# Patient Record
Sex: Female | Born: 1935 | Race: White | Hispanic: No | Smoking: Never smoker
Health system: Southern US, Community
[De-identification: ages and names within clinical notes are randomized; demographics above are authoritative.]

## PROBLEM LIST (undated history)

## (undated) DIAGNOSIS — I219 Acute myocardial infarction, unspecified: Secondary | ICD-10-CM

## (undated) DIAGNOSIS — I714 Abdominal aortic aneurysm, without rupture, unspecified: Secondary | ICD-10-CM

## (undated) DIAGNOSIS — Z5189 Encounter for other specified aftercare: Secondary | ICD-10-CM

## (undated) DIAGNOSIS — I509 Heart failure, unspecified: Secondary | ICD-10-CM

## (undated) DIAGNOSIS — Z9189 Other specified personal risk factors, not elsewhere classified: Secondary | ICD-10-CM

## (undated) DIAGNOSIS — Z9981 Dependence on supplemental oxygen: Secondary | ICD-10-CM

## (undated) DIAGNOSIS — F329 Major depressive disorder, single episode, unspecified: Secondary | ICD-10-CM

## (undated) DIAGNOSIS — G43909 Migraine, unspecified, not intractable, without status migrainosus: Secondary | ICD-10-CM

## (undated) DIAGNOSIS — R32 Unspecified urinary incontinence: Secondary | ICD-10-CM

## (undated) DIAGNOSIS — G20A1 Parkinson's disease without dyskinesia, without mention of fluctuations: Secondary | ICD-10-CM

## (undated) DIAGNOSIS — F32A Depression, unspecified: Secondary | ICD-10-CM

## (undated) DIAGNOSIS — G20 Parkinson's disease: Secondary | ICD-10-CM

## (undated) DIAGNOSIS — M1990 Unspecified osteoarthritis, unspecified site: Secondary | ICD-10-CM

## (undated) DIAGNOSIS — N390 Urinary tract infection, site not specified: Secondary | ICD-10-CM

## (undated) DIAGNOSIS — I723 Aneurysm of iliac artery: Secondary | ICD-10-CM

## (undated) HISTORY — PX: BREAST SURGERY: SHX581

## (undated) HISTORY — DX: Unspecified urinary incontinence: R32

## (undated) HISTORY — DX: Depression, unspecified: F32.A

## (undated) HISTORY — PX: KNEE SURGERY: SHX244

## (undated) HISTORY — DX: Urinary tract infection, site not specified: N39.0

## (undated) HISTORY — DX: Other specified personal risk factors, not elsewhere classified: Z91.89

## (undated) HISTORY — DX: Unspecified osteoarthritis, unspecified site: M19.90

## (undated) HISTORY — DX: Major depressive disorder, single episode, unspecified: F32.9

## (undated) HISTORY — DX: Migraine, unspecified, not intractable, without status migrainosus: G43.909

## (undated) HISTORY — DX: Encounter for other specified aftercare: Z51.89

---
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
## 1974-04-26 HISTORY — PX: FOOT SURGERY: SHX648

## 1978-04-26 HISTORY — PX: APPENDECTOMY: SHX54

## 2004-06-08 LAB — HM MAMMOGRAPHY: HM MAMMO: NORMAL

## 2013-04-26 HISTORY — PX: OTHER SURGICAL HISTORY: SHX169

## 2014-12-24 ENCOUNTER — Telehealth: Payer: Self-pay | Admitting: Nurse Practitioner

## 2014-12-24 ENCOUNTER — Ambulatory Visit: Payer: Self-pay | Admitting: Nurse Practitioner

## 2014-12-24 DIAGNOSIS — Z0289 Encounter for other administrative examinations: Secondary | ICD-10-CM

## 2014-12-24 NOTE — Telephone Encounter (Signed)
Your NP for today Jennifer Olson, Jennifer Olson is not able to make her appt. Her daughter went to get her up and her [name] is acting up she cannot stand up she's dizzy. Thank You!

## 2014-12-25 NOTE — Telephone Encounter (Signed)
Okay, thanks

## 2015-01-02 ENCOUNTER — Ambulatory Visit (INDEPENDENT_AMBULATORY_CARE_PROVIDER_SITE_OTHER): Payer: Medicare Other | Admitting: Nurse Practitioner

## 2015-01-02 ENCOUNTER — Encounter (INDEPENDENT_AMBULATORY_CARE_PROVIDER_SITE_OTHER): Payer: Self-pay

## 2015-01-02 ENCOUNTER — Encounter: Payer: Self-pay | Admitting: Nurse Practitioner

## 2015-01-02 VITALS — BP 102/64 | HR 79 | Temp 98.4°F | Resp 14 | Wt 205.4 lb

## 2015-01-02 DIAGNOSIS — J449 Chronic obstructive pulmonary disease, unspecified: Secondary | ICD-10-CM

## 2015-01-02 DIAGNOSIS — Z7689 Persons encountering health services in other specified circumstances: Secondary | ICD-10-CM | POA: Insufficient documentation

## 2015-01-02 DIAGNOSIS — F418 Other specified anxiety disorders: Secondary | ICD-10-CM | POA: Insufficient documentation

## 2015-01-02 DIAGNOSIS — E785 Hyperlipidemia, unspecified: Secondary | ICD-10-CM

## 2015-01-02 DIAGNOSIS — R32 Unspecified urinary incontinence: Secondary | ICD-10-CM

## 2015-01-02 DIAGNOSIS — I509 Heart failure, unspecified: Secondary | ICD-10-CM | POA: Insufficient documentation

## 2015-01-02 DIAGNOSIS — E559 Vitamin D deficiency, unspecified: Secondary | ICD-10-CM

## 2015-01-02 DIAGNOSIS — G479 Sleep disorder, unspecified: Secondary | ICD-10-CM

## 2015-01-02 DIAGNOSIS — Z7189 Other specified counseling: Secondary | ICD-10-CM

## 2015-01-02 DIAGNOSIS — K219 Gastro-esophageal reflux disease without esophagitis: Secondary | ICD-10-CM | POA: Insufficient documentation

## 2015-01-02 DIAGNOSIS — M1990 Unspecified osteoarthritis, unspecified site: Secondary | ICD-10-CM | POA: Insufficient documentation

## 2015-01-02 MED ORDER — ROPINIROLE HCL 2 MG PO TABS: 2.0000 mg | ORAL_TABLET | Freq: Three times a day (TID) | ORAL | Status: DC

## 2015-01-02 MED ORDER — TRAZODONE HCL 50 MG PO TABS: 50.0000 mg | ORAL_TABLET | Freq: Every day | ORAL | Status: AC

## 2015-01-02 MED ORDER — POTASSIUM CHLORIDE ER 20 MEQ PO TBCR
40.0000 meq | EXTENDED_RELEASE_TABLET | Freq: Every day | ORAL | Status: AC
Start: 2015-01-02 — End: ?

## 2015-01-02 MED ORDER — TAMSULOSIN HCL 0.4 MG PO CAPS: 0.4000 mg | ORAL_CAPSULE | Freq: Every day | ORAL | Status: DC

## 2015-01-02 MED ORDER — SOLIFENACIN SUCCINATE 10 MG PO TABS: 10.0000 mg | ORAL_TABLET | Freq: Every day | ORAL | Status: DC

## 2015-01-02 MED ORDER — ERGOCALCIFEROL 1.25 MG (50000 UT) PO CAPS: 50000.0000 [IU] | ORAL_CAPSULE | ORAL | Status: DC

## 2015-01-02 MED ORDER — BUSPIRONE HCL 5 MG PO TABS: 5.0000 mg | ORAL_TABLET | Freq: Two times a day (BID) | ORAL | Status: AC

## 2015-01-02 MED ORDER — ESCITALOPRAM OXALATE 10 MG PO TABS: 10.0000 mg | ORAL_TABLET | Freq: Every day | ORAL | Status: AC

## 2015-01-02 MED ORDER — SIMVASTATIN 20 MG PO TABS: 20.0000 mg | ORAL_TABLET | Freq: Every day | ORAL | Status: AC

## 2015-01-02 MED ORDER — PREDNISONE 5 MG PO TABS: 5.0000 mg | ORAL_TABLET | Freq: Every day | ORAL | Status: DC

## 2015-01-02 MED ORDER — PANTOPRAZOLE SODIUM 20 MG PO TBEC: 20.0000 mg | DELAYED_RELEASE_TABLET | Freq: Every day | ORAL | Status: AC

## 2015-01-02 MED ORDER — MELOXICAM 15 MG PO TABS: 15.0000 mg | ORAL_TABLET | Freq: Every day | ORAL | Status: AC

## 2015-01-02 MED ORDER — FUROSEMIDE 40 MG PO TABS: 40.0000 mg | ORAL_TABLET | Freq: Every day | ORAL | Status: AC

## 2015-01-02 NOTE — Assessment & Plan Note (Addendum)
Discussed acute and chronic issues. Reviewed health maintenance measures, PFSHx, and immunizations. Obtain records from previous facility.   Time spent face to face was 45 minutes with greater than 50% of time spent on discussing health history, reviewing records, medication reconcilliation, and discussing multiple complaints.

## 2015-01-02 NOTE — Assessment & Plan Note (Signed)
Patient is on nighttime oxygen. Only information that was given in report from past assisted living facility was that she is on oxygen and that they're using [employer] for supplies. Patient nor daughter have further information on this today.

## 2015-01-02 NOTE — Assessment & Plan Note (Addendum)
Unsure if patient is using Requip for restless leg syndrome, patient reports "tremors". Will refill today.  Patient stable on trazodone 50 mg at nighttime will refill this as well.

## 2015-01-02 NOTE — Progress Notes (Signed)
Pre visit review using our clinic review tool, if applicable. No additional management support is needed unless otherwise documented below in the visit note. 

## 2015-01-02 NOTE — Progress Notes (Signed)
Patient ID: Jennifer Olson, female    DOB: 1926/01/11  Age: 81 y.o. MRN: [id number]  CC: Establish Care   HPI Jennifer Olson presents for establishing care and CC of medication refills, hoarseness, pruritic ears, and getting set up with referrals for care. Patient is accompanied by daughter today.   1) New pt info:   Immunizations- pna 2015, tdap 2015   Mammogram- 2006  Bone Density- Unknown   Colonoscopy- Last one 1974  Eye Exam- 11/2014, normal per pt  Dental Exam- Dentures, does not wear due to poor fit  2) Chronic Problems-  COPD- Unknown info- need records   Patient using oxygen at night, unknown supplies or settings. Patient is using [employer] for supplies  CHF- Lasix 40 mg daily, unknown last cardiology visit   Arthritis- Was taking Oxycodone-APAP unknown dosage every 12 hours.  3) Acute Problems-  Medication refills: Pt daughter reports pain medications and Xanax did not come with her to [place].  Recent move from a nursing home in [place]. Patient's daughter has minimal records from nursing home.   Patient lives with daughter, son-in-law, granddaughter and no home health is requested at this time.  Patient's daughter will be transporting her to and from her appointments. They will be maintaining her diet at home.  Patient reports she would not like to pursue physical therapy at this time due to doing own exercises at home.   Pt reports hoarseness today and itching ears.   History Jennifer Olson has a past medical history of Arthritis; Depression; History of fainting spells of unknown cause; Migraines; Blood transfusion without reported diagnosis; UTI (urinary tract infection); and Urinary incontinence.   She has past surgical history that includes Breast surgery; Appendectomy (1980); Tonsillectomy and adenoidectomy (1934); Cesarean section; Foot surgery (1976); Knee surgery; and Broken Hip (2015).   Her family history includes Sudden death in her brother.She reports that she has  never smoked. She does not have any smokeless tobacco history on file. She reports that she does not drink alcohol or use illicit drugs.  No outpatient prescriptions prior to visit.   No facility-administered medications prior to visit.    ROS Review of Systems  Constitutional: Negative for fever, chills, diaphoresis and fatigue.  HENT: Positive for tinnitus. Negative for trouble swallowing.   Eyes: Negative for visual disturbance.  Respiratory: Negative for chest tightness, shortness of breath and wheezing.   Cardiovascular: Positive for leg swelling. Negative for chest pain and palpitations.  Gastrointestinal: Negative for nausea, vomiting, diarrhea and constipation.  Genitourinary: Positive for urgency and frequency.  Musculoskeletal: Positive for back pain, arthralgias and gait problem.       Patient is in wheelchair today  Skin: Negative for rash.  Neurological: Positive for dizziness and tremors. Negative for weakness, numbness and headaches.       Intermittent dizziness  Psychiatric/Behavioral: Positive for sleep disturbance. Negative for suicidal ideas. The patient is nervous/anxious.     Objective:  BP 102/64 mmHg  Pulse 79  Temp(Src) 98.4 F (36.9 C)  Resp 14  Wt 205 lb 6.4 oz (93.169 kg)  SpO2 94%  Physical Exam  Constitutional: She is oriented to person, place, and time. She appears well-developed and well-nourished. No distress.  HENT:  Head: Normocephalic and atraumatic.  Right Ear: External ear normal.  Left Ear: External ear normal.  Mouth/Throat: No oropharyngeal exudate.  No teeth  Eyes: Right eye exhibits no discharge. Left eye exhibits no discharge. No scleral icterus.  Glasses  Pulmonary/Chest: Effort normal.  Musculoskeletal:  Toes are arthritic and on right foot second toe is crossed over great toe  Neurological: She is alert and oriented to person, place, and time.  Skin: Skin is warm and dry. No rash noted. She is not diaphoretic.  Psychiatric:  She has a normal mood and affect. Her behavior is normal. Judgment and thought content normal.   Assessment & Plan:   Jennifer Olson was seen today for establish care.  Diagnoses and all orders for this visit:  Depression with anxiety  Encounter to establish care  Sleep disturbance  Urinary incontinence, unspecified incontinence type  Hyperlipidemia  Gastroesophageal reflux disease without esophagitis  Arthritis  Congestive heart failure, unspecified congestive heart failure chronicity, unspecified congestive heart failure type  Vitamin D deficiency  Chronic obstructive pulmonary disease, unspecified COPD, unspecified chronic bronchitis type  Other orders -     traZODone (DESYREL) 50 MG tablet; Take 1 tablet (50 mg total) by mouth at bedtime. -     tamsulosin (FLOMAX) 0.4 MG CAPS capsule; Take 1 capsule (0.4 mg total) by mouth at bedtime. -     solifenacin (VESICARE) 10 MG tablet; Take 1 tablet (10 mg total) by mouth daily. -     Potassium Chloride ER 20 MEQ TBCR; Take 40 mEq by mouth daily with breakfast. -     pantoprazole (PROTONIX) 20 MG tablet; Take 1 tablet (20 mg total) by mouth daily. -     meloxicam (MOBIC) 15 MG tablet; Take 1 tablet (15 mg total) by mouth daily. -     furosemide (LASIX) 40 MG tablet; Take 1 tablet (40 mg total) by mouth daily. -     escitalopram (LEXAPRO) 10 MG tablet; Take 1 tablet (10 mg total) by mouth daily. -     ergocalciferol (VITAMIN D2) 50000 UNITS capsule; Take 1 capsule (50,000 Units total) by mouth once a week. -     predniSONE (DELTASONE) 5 MG tablet; Take 1 tablet (5 mg total) by mouth daily with breakfast. -     rOPINIRole (REQUIP) 2 MG tablet; Take 1 tablet (2 mg total) by mouth 3 (three) times daily. -     simvastatin (ZOCOR) 20 MG tablet; Take 1 tablet (20 mg total) by mouth at bedtime. -     busPIRone (BUSPAR) 5 MG tablet; Take 1 tablet (5 mg total) by mouth 2 (two) times daily.   I have discontinued Jennifer Olson's ALPRAZOLAM PO,  OXYCODONE ER PO, potassium chloride, furosemide, rOPINIRole, cholecalciferol, escitalopram, predniSONE, and simvastatin. I have also changed her traZODone, tamsulosin, solifenacin, pantoprazole, meloxicam, furosemide, escitalopram, and ergocalciferol. Additionally, I am having her start on predniSONE, rOPINIRole, simvastatin, and busPIRone. Lastly, I am having her maintain her fluticasone and Potassium Chloride ER.  Meds ordered this encounter  Medications  . DISCONTD: ALPRAZOLAM PO    Sig: Take by mouth as needed (1/2 tablet daily as needed).  . DISCONTD: OXYCODONE ER PO    Sig: Take by mouth every 12 (twelve) hours. UNKNOWN MG  . DISCONTD: tamsulosin (FLOMAX) 0.4 MG CAPS capsule    Sig: Take 0.4 mg by mouth at bedtime. UNKNOWN MG  . DISCONTD: pantoprazole (PROTONIX) 20 MG tablet    Sig: Take 20 mg by mouth daily. UNKNOWN MG  . DISCONTD: potassium chloride (K-DUR,KLOR-CON) 10 MEQ tablet    Sig: Take 10 mEq by mouth 2 (two) times daily. UNKNOWN MG  . DISCONTD: furosemide (LASIX) 10 MG/ML solution    Sig: Take by mouth daily. UNKNOWN MG  . DISCONTD: rOPINIRole (REQUIP) 1 MG tablet  Sig: Take 1 mg by mouth 3 (three) times daily. UNKNOWN MG  . DISCONTD: solifenacin (VESICARE) 10 MG tablet    Sig: Take 10 mg by mouth daily.   [name] DISCONTD: cholecalciferol (VITAMIN D) 1000 UNITS tablet    Sig: Take 1,000 Units by mouth daily. UNKNOWN UNITS  . DISCONTD: escitalopram (LEXAPRO) 5 MG tablet    Sig: Take 5 mg by mouth daily. UNKNOWN MG  . DISCONTD: predniSONE (DELTASONE) 1 MG tablet    Sig: Take 1 mg by mouth daily with breakfast. UNKNOWN MG  . DISCONTD: simvastatin (ZOCOR) 10 MG tablet    Sig: Take 10 mg by mouth daily. UNKNOWN MG  . DISCONTD: traZODone (DESYREL) 50 MG tablet    Sig: Take 50 mg by mouth at bedtime. UNKNOWN DOSE  . DISCONTD: meloxicam (MOBIC) 15 MG tablet    Sig: Take 15 mg by mouth daily. UNKNOWN MG  . DISCONTD: fluticasone (VERAMYST) 27.5 MCG/SPRAY nasal spray    Sig: Place  2 sprays into the nose daily. UNKNOWN DOSE  . DISCONTD: ergocalciferol (VITAMIN D2) 50000 UNITS capsule    Sig: Take 50,000 Units by mouth once a week.  [name] DISCONTD: escitalopram (LEXAPRO) 10 MG tablet    Sig: Take 10 mg by mouth daily.  . fluticasone (FLONASE) 50 MCG/ACT nasal spray    Sig: Place 2 sprays into both nostrils daily.  [name] DISCONTD: furosemide (LASIX) 40 MG tablet    Sig: Take 40 mg by mouth daily.  [name] DISCONTD: Potassium Chloride ER 20 MEQ TBCR    Sig: Take 40 mEq by mouth daily with breakfast.  . traZODone (DESYREL) 50 MG tablet    Sig: Take 1 tablet (50 mg total) by mouth at bedtime.    Dispense:  30 tablet    Refill:  2    Order Specific Question:  Supervising Provider    Answer:  Duncan Dull L [[id number]]  . tamsulosin (FLOMAX) 0.4 MG CAPS capsule    Sig: Take 1 capsule (0.4 mg total) by mouth at bedtime.    Dispense:  30 capsule    Refill:  2    Order Specific Question:  Supervising Provider    Answer:  Duncan Dull L [[id number]]  . solifenacin (VESICARE) 10 MG tablet    Sig: Take 1 tablet (10 mg total) by mouth daily.    Dispense:  30 tablet    Refill:  2    Order Specific Question:  Supervising Provider    Answer:  Duncan Dull L [[id number]]  . Potassium Chloride ER 20 MEQ TBCR    Sig: Take 40 mEq by mouth daily with breakfast.    Dispense:  60 tablet    Refill:  2    Order Specific Question:  Supervising Provider    Answer:  Duncan Dull L [[id number]]  . pantoprazole (PROTONIX) 20 MG tablet    Sig: Take 1 tablet (20 mg total) by mouth daily.    Dispense:  30 tablet    Refill:  2    Order Specific Question:  Supervising Provider    Answer:  Duncan Dull L [[id number]]  . meloxicam (MOBIC) 15 MG tablet    Sig: Take 1 tablet (15 mg total) by mouth daily.    Dispense:  30 tablet    Refill:  2    Order Specific Question:  Supervising Provider    Answer:  Duncan Dull L [[id number]]  . furosemide (LASIX) 40 MG tablet    Sig: Take 1 tablet (40 mg  total) by mouth daily.     Dispense:  30 tablet    Refill:  2    Order Specific Question:  Supervising Provider    Answer:  Duncan Dull L [[id number]]  . escitalopram (LEXAPRO) 10 MG tablet    Sig: Take 1 tablet (10 mg total) by mouth daily.    Dispense:  30 tablet    Refill:  2    Order Specific Question:  Supervising Provider    Answer:  Duncan Dull L [[id number]]  . ergocalciferol (VITAMIN D2) 50000 UNITS capsule    Sig: Take 1 capsule (50,000 Units total) by mouth once a week.    Dispense:  12 capsule    Refill:  0    Order Specific Question:  Supervising Provider    Answer:  Duncan Dull L [[id number]]  . predniSONE (DELTASONE) 5 MG tablet    Sig: Take 1 tablet (5 mg total) by mouth daily with breakfast.    Dispense:  30 tablet    Refill:  2    Order Specific Question:  Supervising Provider    Answer:  Duncan Dull L [[id number]]  . rOPINIRole (REQUIP) 2 MG tablet    Sig: Take 1 tablet (2 mg total) by mouth 3 (three) times daily.    Dispense:  90 tablet    Refill:  2    Order Specific Question:  Supervising Provider    Answer:  Duncan Dull L [[id number]]  . simvastatin (ZOCOR) 20 MG tablet    Sig: Take 1 tablet (20 mg total) by mouth at bedtime.    Dispense:  30 tablet    Refill:  2    Order Specific Question:  Supervising Provider    Answer:  Duncan Dull L [[id number]]  . busPIRone (BUSPAR) 5 MG tablet    Sig: Take 1 tablet (5 mg total) by mouth 2 (two) times daily.    Dispense:  60 tablet    Refill:  2    Order Specific Question:  Supervising Provider    Answer:  Sherlene Shams [[id number]]     Follow-up: Return in about 4 weeks (around 01/30/2015) for Multiple concerns .

## 2015-01-02 NOTE — Patient Instructions (Signed)
Your medications will be sent to the pharmacy tonight.   Drink water! 3-4 cups daily   Ears- use Vaseline on your pinky finger and wipe it around the outside of your ear   No Q-tips   We will up your vesicare (for over active bladder to 10 mg daily instead of 5 mg)   Busprione will be sent in for your nerves instead of xanax.   We will get you set up with a Cardiologist- heart failure      Podiatrist- feet concerns     Orthopedist- arthritis  Follow up with me in 1 month.

## 2015-01-02 NOTE — Assessment & Plan Note (Signed)
Patient reports extensive arthritis. Patient reports it is worse in shoulders and back. Patient also reports hands and knees. Patient was on pain medication and [place]. Patient will be set up with orthopedics for evaluation and pain management.

## 2015-01-02 NOTE — Assessment & Plan Note (Addendum)
Patient reports she is stable on current Lexapro. I discussed not using Xanax. Patient has been off for some time now. Patient is willing to try BuSpar. Patient had a counselor come to her assisted living facility once a month. We'll follow-up in one month

## 2015-01-02 NOTE — Assessment & Plan Note (Signed)
Patient reports taking vitamin D2 50,000 units once weekly on Monday nights. Will obtain last labs.

## 2015-01-02 NOTE — Assessment & Plan Note (Signed)
Diagnosis was written on sheet from assisted living facility. Will obtain more records regarding this. We'll set up with cardiology. Will refill Lasix 40 mg daily. Patient is also on potassium 20 mEq twice daily.

## 2015-01-02 NOTE — Assessment & Plan Note (Signed)
Patient is stable on pantoprazole. Will refill this today patient reports some hoarseness possibly due to GERD.

## 2015-01-02 NOTE — Assessment & Plan Note (Signed)
Patient is using adult pull-up in combination with an extra pad. Patient reports she is unsure about seeing a urologist in the past. Patient is currently on Flomax and Vesicare. Patient is also on Lasix which exacerbates issue. We'll refill the Vesicare and Flomax today. We'll follow-up in one month.

## 2015-01-02 NOTE — Assessment & Plan Note (Signed)
Patient is currently stable on simvastatin. Will refill and obtain last labs.

## 2015-01-06 ENCOUNTER — Other Ambulatory Visit: Payer: Self-pay | Admitting: Nurse Practitioner

## 2015-01-07 ENCOUNTER — Other Ambulatory Visit: Payer: Self-pay | Admitting: Nurse Practitioner

## 2015-01-07 DIAGNOSIS — I509 Heart failure, unspecified: Secondary | ICD-10-CM

## 2015-01-07 DIAGNOSIS — M2550 Pain in unspecified joint: Secondary | ICD-10-CM

## 2015-01-07 DIAGNOSIS — M2060 Acquired deformities of toe(s), unspecified, unspecified foot: Secondary | ICD-10-CM

## 2015-01-08 ENCOUNTER — Encounter: Payer: Self-pay | Admitting: Nurse Practitioner

## 2015-01-10 ENCOUNTER — Encounter: Payer: Self-pay | Admitting: Nurse Practitioner

## 2015-01-16 ENCOUNTER — Encounter: Payer: Self-pay | Admitting: *Deleted

## 2015-01-16 ENCOUNTER — Emergency Department
Admission: EM | Admit: 2015-01-16 | Discharge: 2015-01-16 | Disposition: A | Discharge status: Home / Self Care | Payer: Medicare Other | Attending: Emergency Medicine | Admitting: Emergency Medicine

## 2015-01-16 ENCOUNTER — Emergency Department: Payer: Medicare Other

## 2015-01-16 DIAGNOSIS — Z79899 Other long term (current) drug therapy: Secondary | ICD-10-CM | POA: Insufficient documentation

## 2015-01-16 DIAGNOSIS — G8929 Other chronic pain: Secondary | ICD-10-CM | POA: Insufficient documentation

## 2015-01-16 DIAGNOSIS — M545 Low back pain, unspecified: Secondary | ICD-10-CM

## 2015-01-16 DIAGNOSIS — S59912A Unspecified injury of left forearm, initial encounter: Secondary | ICD-10-CM | POA: Diagnosis present

## 2015-01-16 DIAGNOSIS — Z880 Allergy status to penicillin: Secondary | ICD-10-CM | POA: Diagnosis not present

## 2015-01-16 DIAGNOSIS — Y9289 Other specified places as the place of occurrence of the external cause: Secondary | ICD-10-CM | POA: Insufficient documentation

## 2015-01-16 DIAGNOSIS — Y9389 Activity, other specified: Secondary | ICD-10-CM | POA: Insufficient documentation

## 2015-01-16 DIAGNOSIS — S3992XA Unspecified injury of lower back, initial encounter: Secondary | ICD-10-CM | POA: Diagnosis not present

## 2015-01-16 DIAGNOSIS — Y998 Other external cause status: Secondary | ICD-10-CM | POA: Diagnosis not present

## 2015-01-16 DIAGNOSIS — S51812A Laceration without foreign body of left forearm, initial encounter: Secondary | ICD-10-CM | POA: Insufficient documentation

## 2015-01-16 DIAGNOSIS — Z791 Long term (current) use of non-steroidal anti-inflammatories (NSAID): Secondary | ICD-10-CM | POA: Diagnosis not present

## 2015-01-16 DIAGNOSIS — W1839XA Other fall on same level, initial encounter: Secondary | ICD-10-CM | POA: Diagnosis not present

## 2015-01-16 HISTORY — DX: Abdominal aortic aneurysm, without rupture, unspecified: I71.40

## 2015-01-16 HISTORY — DX: Abdominal aortic aneurysm, without rupture: I71.4

## 2015-01-16 HISTORY — DX: Aneurysm of iliac artery: I72.3

## 2015-01-16 MED ORDER — CYCLOBENZAPRINE HCL 10 MG PO TABS
10.0000 mg | ORAL_TABLET | Freq: Once | ORAL | Status: AC
  Administered 2015-01-16: 10 mg via ORAL
  Filled 2015-01-16: qty 1

## 2015-01-16 MED ORDER — BACITRACIN ZINC 500 UNIT/GM EX OINT: TOPICAL_OINTMENT | CUTANEOUS | Status: DC

## 2015-01-16 NOTE — Discharge Instructions (Signed)
Chronic Back Pain  When back pain lasts longer than 3 months, it is called chronic back pain.People with chronic back pain often go through certain periods that are more intense (flare-ups).  CAUSES Chronic back pain can be caused by wear and tear (degeneration) on different structures in your back. These structures include:  The bones of your spine (vertebrae) and the joints surrounding your spinal cord and nerve roots (facets).  The strong, fibrous tissues that connect your vertebrae (ligaments). Degeneration of these structures may result in pressure on your nerves. This can lead to constant pain. HOME CARE INSTRUCTIONS  Avoid bending, heavy lifting, prolonged sitting, and activities which make the problem worse.  Take brief periods of rest throughout the day to reduce your pain. Lying down or standing usually is better than sitting while you are resting.  Take over-the-counter or prescription medicines only as directed by your caregiver. SEEK IMMEDIATE MEDICAL CARE IF:   You have weakness or numbness in one of your legs or feet.  You have trouble controlling your bladder or bowels.  You have nausea, vomiting, abdominal pain, shortness of breath, or fainting. Document Released: 05/20/2004 Document Revised: 07/05/2011 Document Reviewed: 03/27/2011 [facility] Patient Information 2015 [place], [place]. This information is not intended to replace advice given to you by your health care provider. Make sure you discuss any questions you have with your health care provider.  Skin Tear Care A skin tear is a wound in which the top layer of skin has peeled off. This is a common problem with aging because the skin becomes thinner and more fragile as a person gets older. In addition, some medicines, such as oral corticosteroids, can lead to skin thinning if taken for long periods of time.  A skin tear is often repaired with tape or skin adhesive strips. This keeps the skin that has been peeled off  in contact with the healthier skin beneath. Depending on the location of the wound, a bandage (dressing) may be applied over the tape or skin adhesive strips. Sometimes, during the healing process, the skin turns black and dies. Even when this happens, the torn skin acts as a good dressing until the skin underneath gets healthier and repairs itself. HOME CARE INSTRUCTIONS   Change dressings once per day or as directed by your caregiver.  Gently clean the skin tear and the area around the tear using saline solution or mild soap and water.  Do not rub the injured skin dry. Let the area air dry.  Apply petroleum jelly or an antibiotic cream or ointment to keep the tear moist. This will help the wound heal. Do not allow a scab to form.  If the dressing sticks before the next dressing change, moisten it with warm soapy water and gently remove it.  Protect the injured skin until it has healed.  Only take over-the-counter or prescription medicines as directed by your caregiver.  Take showers or baths using warm soapy water. Apply a new dressing after the shower or bath.  Keep all follow-up appointments as directed by your caregiver.  SEEK IMMEDIATE MEDICAL CARE IF:   You have redness, swelling, or increasing pain in the skin tear.  You havepus coming from the skin tear.  You have chills.  You have a red streak that goes away from the skin tear.  You have a bad smell coming from the tear or dressing.  You have a fever or persistent symptoms for more than 2-3 days.  You have a fever  and your symptoms suddenly get worse. MAKE SURE YOU:  Understand these instructions.  Will watch this condition.  Will get help right away if your child is not doing well or gets worse. Document Released: 01/05/2001 Document Revised: 01/05/2012 Document Reviewed: 10/25/2011 [facility] Patient Information 2015 [place], [place]. This information is not intended to replace advice given to you by your health  care provider. Make sure you discuss any questions you have with your health care provider.

## 2015-01-16 NOTE — ED Provider Notes (Signed)
[facility] Emergency Department Provider Note  ____________________________________________  Time seen: Approximately 8 PM  I have reviewed the triage vital signs and the nursing notes.   HISTORY  Chief Complaint Fall    HPI Jennifer Olson is a 81 y.o. female with a history of chronic back pain who is presenting with worsening lower back pain after fall 2 weeks ago. The patient normally takes 2x7.5 mg oxycodone for pain control as of 3 weeks ago butsince leaving her nursing home 2 weeks ago she has been trying to wean herself off, only taking one a time. The patient was walking without her walker when she fell backwards. She has balance issues which is why she uses the walker. She says that she lost balance when she wasn't using her walker and fell backwards onto her coccyx. She did not hit her head or lose consciousness. She does have baseline urinary incontinence but has no worsening of incontinence or loss of continence of her stool. She describes the pain is in her lower buttock as well as the left side of the pelvis. There is a small amount of radiation into the proximal thighs posteriorly. Denies any numbness or weakness. However, does have pain with range of motion of her legs bilaterally.   Past Medical History  Diagnosis Date  . Arthritis   . Depression   . History of fainting spells of unknown cause   . Migraines   . Blood transfusion without reported diagnosis   . UTI (urinary tract infection)   . Urinary incontinence   . Aneurysm of abdominal aorta   . Aneurysm artery, iliac     Patient Active Problem List   Diagnosis Date Noted  . Depression with anxiety 01/02/2015  . Encounter to establish care 01/02/2015  . Sleep disturbance 01/02/2015  . Urinary incontinence 01/02/2015  . Hyperlipidemia 01/02/2015  . GERD (gastroesophageal reflux disease) 01/02/2015  . Arthritis 01/02/2015  . Congestive heart failure 01/02/2015  . Vitamin D deficiency  01/02/2015  . COPD (chronic obstructive pulmonary disease) 01/02/2015    Past Surgical History  Procedure Laterality Date  . Breast surgery      Biopsy  . Appendectomy  1980  . Tonsillectomy and adenoidectomy  1934  . Cesarean section      1956/1962  . Foot surgery  1976    Both Feet  . Knee surgery      Both Knees/1999/2000  . Broken hip  2015    Right Hip    Current Outpatient Rx  Name  Route  Sig  Dispense  Refill  . busPIRone (BUSPAR) 5 MG tablet   Oral   Take 1 tablet (5 mg total) by mouth 2 (two) times daily.   60 tablet   2   . ergocalciferol (VITAMIN D2) 50000 UNITS capsule   Oral   Take 1 capsule (50,000 Units total) by mouth once a week.   12 capsule   0   . escitalopram (LEXAPRO) 10 MG tablet   Oral   Take 1 tablet (10 mg total) by mouth daily.   30 tablet   2   . fluticasone (FLONASE) 50 MCG/ACT nasal spray   Each Nare   Place 2 sprays into both nostrils daily.         . furosemide (LASIX) 40 MG tablet   Oral   Take 1 tablet (40 mg total) by mouth daily.   30 tablet   2   . meloxicam (MOBIC) 15 MG tablet  Oral   Take 1 tablet (15 mg total) by mouth daily.   30 tablet   2   . pantoprazole (PROTONIX) 20 MG tablet   Oral   Take 1 tablet (20 mg total) by mouth daily.   30 tablet   2   . Potassium Chloride ER 20 MEQ TBCR   Oral   Take 40 mEq by mouth daily with breakfast.   60 tablet   2   . predniSONE (DELTASONE) 5 MG tablet   Oral   Take 1 tablet (5 mg total) by mouth daily with breakfast.   30 tablet   2   . rOPINIRole (REQUIP) 2 MG tablet   Oral   Take 1 tablet (2 mg total) by mouth 3 (three) times daily.   90 tablet   2   . simvastatin (ZOCOR) 20 MG tablet   Oral   Take 1 tablet (20 mg total) by mouth at bedtime.   30 tablet   2   . solifenacin (VESICARE) 10 MG tablet   Oral   Take 1 tablet (10 mg total) by mouth daily.   30 tablet   2   . tamsulosin (FLOMAX) 0.4 MG CAPS capsule   Oral   Take 1 capsule (0.4  mg total) by mouth at bedtime.   30 capsule   2   . traZODone (DESYREL) 50 MG tablet   Oral   Take 1 tablet (50 mg total) by mouth at bedtime.   30 tablet   2     Allergies Penicillins  Family History  Problem Relation Age of Onset  . Sudden death Brother     Social History Social History  Substance Use Topics  . Smoking status: Never Smoker   . Smokeless tobacco: None  . Alcohol Use: No    Review of Systems Constitutional: No fever/chills Eyes: No visual changes. ENT: No sore throat. Cardiovascular: Denies chest pain. Respiratory: Denies shortness of breath. Gastrointestinal: No abdominal pain.  No nausea, no vomiting.  No diarrhea.  No constipation. Genitourinary: Negative for dysuria. Musculoskeletal: Chronic back pain worsening as of 2 weeks ago after the fall.  Skin: Negative for rash. Neurological: Negative for headaches, focal weakness or numbness.  10-point ROS otherwise negative.  ____________________________________________   PHYSICAL EXAM:  VITAL SIGNS: ED Triage Vitals  Enc Vitals Group     BP 01/16/15 1830 106/65 mmHg     Pulse Rate 01/16/15 1830 76     Resp 01/16/15 1830 18     Temp 01/16/15 1830 98.4 F (36.9 C)     Temp Source 01/16/15 1830 Oral     SpO2 01/16/15 1830 92 %     Weight 01/16/15 1830 205 lb (92.987 kg)     Height 01/16/15 1830  (1.676 m)     Head Cir --      Peak Flow --      Pain Score 01/16/15 1833 10     Pain Loc --      Pain Edu? --      Excl. in GC? --     Constitutional: Alert and oriented. Well appearing and in no acute distress. Eyes: Conjunctivae are normal. PERRL. EOMI. Head: Atraumatic. Nose: No congestion/rhinnorhea.  Neck: No stridor.   Cardiovascular: Normal rate, regular rhythm. Grossly normal heart sounds.  Good peripheral circulation. Respiratory: Normal respiratory effort.  No retractions. Lungs CTAB. Gastrointestinal: Soft and nontender. No distention. No abdominal bruits. No CVA  tenderness. Musculoskeletal: No lower extremity tenderness  nor edema.  No joint effusions. No saddle anesthesia. 5 out of 5 strength to bilateral lower extremities. Mild tenderness palpation over the left sacroiliac joint. No ecchymosis over the coccyx or tenderness over the coccyx. No tenderness to the hips bilaterally. Negative straight leg raise bilaterally. Neurologic:  Normal speech and language. No gross focal neurologic deficits are appreciated. No gait instability. Skin:  Left forearm skin tear in triangular shape with flap 4 x 2 cm. Superficial. Small amount of yellow exudate covering the dermis. Skin flap is thin and dusky. No surrounding induration or erythema. Nontender to palpation. Psychiatric: Mood and affect are normal. Speech and behavior are normal.  ____________________________________________   LABS (all labs ordered are listed, but only abnormal results are displayed)  Labs Reviewed - No data to display ____________________________________________  EKG   ____________________________________________  RADIOLOGY  No fracture or dislocation of the coccyx. Pelvis x-ray with chronic changes demonstrating demineralization. No acute fracture dislocation ____________________________________________   PROCEDURES  Skin flap with chlorhexidine and using a suture removal scissors I debrided the dead skin. Covered with bacitracin and bandage. We'll discharge with bacitracin ointment. ____________________________________________   INITIAL IMPRESSION / ASSESSMENT AND PLAN / ED COURSE  Pertinent labs & imaging results that were available during my care of the patient were reviewed by me and considered in my medical decision making (see chart for details).  ----------------------------------------- 10:41 PM on 01/16/2015 -----------------------------------------  No acute findings on the x-ray imaging. Patient able to stand with assistance. Advised the patient and her  family to raise her medication levels of her oxycodone back to her originally prescribed level of 2 pills at a time. Do not suspect any spinal cord compression. No red flags such as saddle anesthesia or weakness of the bilateral lower extremities. Likely worsening of chronic pain. Patient is new to the area and will give follow-up with [facility] clinic. ____________________________________________   FINAL CLINICAL IMPRESSION(S) / ED DIAGNOSES  Acute on chronic back pain. Initial visit.    Myrna Blazer, MD 01/16/15 [phone]

## 2015-01-16 NOTE — ED Notes (Signed)
Patient states two weeks ago she was standing at her dresser and fell backwards in a sitting position on a hardwood floor. Patient c/o chronic low back pain that has worsened and weakness/difficulty in raising her legs.

## 2015-01-17 ENCOUNTER — Telehealth: Payer: Self-pay | Admitting: Nurse Practitioner

## 2015-01-17 NOTE — Telephone Encounter (Signed)
Pt daughter called needing a appt for a ER follow up. But daughter wants to know if she will be able to get pain medication at her appt that's scheduled for Monday ? Thank You

## 2015-01-17 NOTE — Telephone Encounter (Signed)
Spoke with pts daughter advised Mondays appoint is ER follow up.  Verbalized understanding

## 2015-01-20 ENCOUNTER — Ambulatory Visit (INDEPENDENT_AMBULATORY_CARE_PROVIDER_SITE_OTHER): Payer: Medicare Other | Admitting: Nurse Practitioner

## 2015-01-20 ENCOUNTER — Encounter: Payer: Self-pay | Admitting: Nurse Practitioner

## 2015-01-20 VITALS — BP 120/68 | HR 71 | Temp 98.7°F

## 2015-01-20 DIAGNOSIS — G894 Chronic pain syndrome: Secondary | ICD-10-CM

## 2015-01-20 MED ORDER — PREDNISONE 10 MG PO TABS: ORAL_TABLET | ORAL | Status: DC

## 2015-01-20 MED ORDER — OXYCODONE HCL 7.5 MG PO TABS: 2.0000 | ORAL_TABLET | Freq: Two times a day (BID) | ORAL | Status: DC

## 2015-01-20 NOTE — Progress Notes (Signed)
Pre visit review using our clinic review tool, if applicable. No additional management support is needed unless otherwise documented below in the visit note. 

## 2015-01-20 NOTE — Patient Instructions (Signed)
Stop the Meloxicam and Prednisone 5 mg tablets while doing the taper.   Prednisone with breakfast  6 tablets on day 1, 5 tablets on day 2, 4 tablets on day 3, 3 tablets on day 4, 2 tablets day 5, 1 tablet on day 6...done! Take together do not take spaced out. Don't take past lunch- inhibits sleep.   Follow up in 2 weeks.

## 2015-01-20 NOTE — Progress Notes (Signed)
Patient ID: Jennifer Olson, female    DOB: [birth year]  Age: 81 y.o. MRN: [id number]  CC: Follow-up   HPI Arrington Kuri presents for ER follow up.   1) Fall- lost balance, denies loss of consciousness,  Standing at her dresser, worsening to the point of not walking Taken to ER on 9/22 at [facility]  Provider note from ED states no fracture or dislocation of the coccyx and pelvis. Pt is able to stand with assistance. She was instructed to up her oxycodone to the original level of 2 twice a day. Pt has no red flags for neurological emergencies.  Oxycodone 2 tablets in am and 2 in the pm   She is stable today. Daughter is accompanying her and pt is in wheelchair. She reports feeling weak and not walking. They are requesting home PT since she was having PT in [place].   History Abiageal has a past medical history of Arthritis; Depression; History of fainting spells of unknown cause; Migraines; Blood transfusion without reported diagnosis; UTI (urinary tract infection); Urinary incontinence; Aneurysm of abdominal aorta; and Aneurysm artery, iliac.   She has past surgical history that includes Breast surgery; Appendectomy (1980); Tonsillectomy and adenoidectomy (1934); Cesarean section; Foot surgery (1976); Knee surgery; and Broken Hip (2015).   Her family history includes Sudden death in her brother.She reports that she has never smoked. She does not have any smokeless tobacco history on file. She reports that she does not drink alcohol or use illicit drugs.  Outpatient Prescriptions Prior to Visit  Medication Sig Dispense Refill  . bacitracin ointment Apply to affected area two times per day for 10 days. 30 g 0  . busPIRone (BUSPAR) 5 MG tablet Take 1 tablet (5 mg total) by mouth 2 (two) times daily. 60 tablet 2  . ergocalciferol (VITAMIN D2) 50000 UNITS capsule Take 1 capsule (50,000 Units total) by mouth once a week. 12 capsule 0  . escitalopram (LEXAPRO) 10 MG tablet Take 1 tablet (10 mg total) by mouth  daily. 30 tablet 2  . fluticasone (FLONASE) 50 MCG/ACT nasal spray Place 2 sprays into both nostrils daily.    . furosemide (LASIX) 40 MG tablet Take 1 tablet (40 mg total) by mouth daily. 30 tablet 2  . meloxicam (MOBIC) 15 MG tablet Take 1 tablet (15 mg total) by mouth daily. 30 tablet 2  . pantoprazole (PROTONIX) 20 MG tablet Take 1 tablet (20 mg total) by mouth daily. 30 tablet 2  . Potassium Chloride ER 20 MEQ TBCR Take 40 mEq by mouth daily with breakfast. 60 tablet 2  . predniSONE (DELTASONE) 5 MG tablet Take 1 tablet (5 mg total) by mouth daily with breakfast. 30 tablet 2  . rOPINIRole (REQUIP) 2 MG tablet Take 1 tablet (2 mg total) by mouth 3 (three) times daily. 90 tablet 2  . simvastatin (ZOCOR) 20 MG tablet Take 1 tablet (20 mg total) by mouth at bedtime. 30 tablet 2  . solifenacin (VESICARE) 10 MG tablet Take 1 tablet (10 mg total) by mouth daily. 30 tablet 2  . tamsulosin (FLOMAX) 0.4 MG CAPS capsule Take 1 capsule (0.4 mg total) by mouth at bedtime. 30 capsule 2  . traZODone (DESYREL) 50 MG tablet Take 1 tablet (50 mg total) by mouth at bedtime. 30 tablet 2   No facility-administered medications prior to visit.    ROS Review of Systems  Constitutional: Negative for fever, chills, diaphoresis and fatigue.  Respiratory: Negative for chest tightness, shortness of breath and wheezing.  Cardiovascular: Negative for chest pain, palpitations and leg swelling.  Gastrointestinal: Negative for nausea, vomiting and diarrhea.  Musculoskeletal: Positive for myalgias, back pain, arthralgias and gait problem. Negative for joint swelling, neck pain and neck stiffness.  Skin: Negative for rash.  Neurological: Negative for dizziness, weakness, numbness and headaches.  Hematological: Does not bruise/bleed easily.  Psychiatric/Behavioral: The patient is not nervous/anxious.     Objective:  BP 120/68 mmHg  Pulse 71  Temp(Src) 98.7 F (37.1 C) (Oral)  SpO2 94%  Physical Exam   Constitutional: She is oriented to person, place, and time. She appears well-developed and well-nourished. No distress.  HENT:  Head: Normocephalic and atraumatic.  Right Ear: External ear normal.  Left Ear: External ear normal.  Abdominal: There is no CVA tenderness.  Musculoskeletal: She exhibits tenderness. She exhibits no edema.  Neurological: She is alert and oriented to person, place, and time.  Iliopsoas 4/5 left and 5/5 right, Tib anterior 5/5 bilateral, EHL 5/5 bilateral, sensation intact upper and lower extremities. Straight leg raise unable to obtain. Patient is fidgeting in wheelchair and looks to be in pain today   Skin: Skin is warm and dry. No rash noted. She is not diaphoretic.  Psychiatric: She has a normal mood and affect. Her behavior is normal. Judgment and thought content normal.      Assessment & Plan:   Andera was seen today for follow-up.  Diagnoses and all orders for this visit:  Chronic pain syndrome -     Ambulatory referral to Pain Clinic -     Ambulatory referral to Home Health  Other orders -     OxyCODONE HCl 7.5 MG TABA; Take 2 tablets by mouth 2 (two) times daily. -     predniSONE (DELTASONE) 10 MG tablet; Take 6 tablets by mouth on day 1 with breakfast then decrease by 1 tablet each day until gone.  I am having Ms. Florentino start on OxyCODONE HCl and predniSONE. I am also having her maintain her fluticasone, traZODone, tamsulosin, solifenacin, Potassium Chloride ER, pantoprazole, meloxicam, furosemide, escitalopram, ergocalciferol, predniSONE, rOPINIRole, simvastatin, busPIRone, and bacitracin.  Meds ordered this encounter  Medications  . OxyCODONE HCl 7.5 MG TABA    Sig: Take 2 tablets by mouth 2 (two) times daily.    Dispense:  120 tablet    Refill:  0    Order Specific Question:  Supervising Provider    Answer:  Duncan Dull L [[id number]]  . predniSONE (DELTASONE) 10 MG tablet    Sig: Take 6 tablets by mouth on day 1 with breakfast then  decrease by 1 tablet each day until gone.    Dispense:  21 tablet    Refill:  0    Order Specific Question:  Supervising Provider    Answer:  Sherlene Shams [[id number]]     Follow-up: Return in about 2 weeks (around 02/03/2015).

## 2015-01-21 ENCOUNTER — Encounter: Payer: Self-pay | Admitting: Nurse Practitioner

## 2015-01-21 ENCOUNTER — Telehealth: Payer: Self-pay

## 2015-01-21 ENCOUNTER — Other Ambulatory Visit: Payer: Self-pay | Admitting: Nurse Practitioner

## 2015-01-21 DIAGNOSIS — G894 Chronic pain syndrome: Secondary | ICD-10-CM | POA: Insufficient documentation

## 2015-01-21 NOTE — Assessment & Plan Note (Addendum)
Pt has chronic pain of back. She was seen by Dr. Hyacinth Meeker for joint pain and had shoulder injected. This has improved somewhat. Pt was having PT in [place]. Will obtain [facility] referral for PT. Pt given 1 script of Oxycodone (5 mg due to pharmacy requirements) 2 tablets twice a day. The CSC was signed, pt unable to give UDS today. She reports 1 tablet twice a day was not helpful. She and her daughter were warned of respiratory depression and falls. Both verbalized understanding. Referral to pain managment placed.   Prednisone taper given with verbal and written instructions (see AVS for instructions and warnings).

## 2015-01-21 NOTE — Telephone Encounter (Signed)
Pharmacy was called and clarified the dosage changed to 5 mg

## 2015-01-21 NOTE — Telephone Encounter (Signed)
Please clarify. She was told to take 2 tablets twice daily per the ER and that is how I wrote for on the prescription. Thanks!

## 2015-01-21 NOTE — Telephone Encounter (Signed)
Pharmacy called to clarify direction for the pt oxycodone. He stated they were not matching. i can call or melanie the number is [phone]

## 2015-01-23 ENCOUNTER — Ambulatory Visit: Payer: Medicare Other | Admitting: Podiatry

## 2015-01-31 ENCOUNTER — Ambulatory Visit: Payer: Medicare Other | Admitting: Nurse Practitioner

## 2015-02-14 ENCOUNTER — Ambulatory Visit (INDEPENDENT_AMBULATORY_CARE_PROVIDER_SITE_OTHER): Payer: Medicare Other | Admitting: Nurse Practitioner

## 2015-02-14 ENCOUNTER — Telehealth: Payer: Self-pay | Admitting: Nurse Practitioner

## 2015-02-14 VITALS — BP 100/60 | HR 76 | Temp 98.9°F | Resp 14 | Ht 72.0 in

## 2015-02-14 DIAGNOSIS — Z23 Encounter for immunization: Secondary | ICD-10-CM | POA: Diagnosis not present

## 2015-02-14 DIAGNOSIS — I509 Heart failure, unspecified: Secondary | ICD-10-CM | POA: Diagnosis not present

## 2015-02-14 DIAGNOSIS — I8311 Varicose veins of right lower extremity with inflammation: Secondary | ICD-10-CM

## 2015-02-14 DIAGNOSIS — G894 Chronic pain syndrome: Secondary | ICD-10-CM

## 2015-02-14 DIAGNOSIS — I872 Venous insufficiency (chronic) (peripheral): Secondary | ICD-10-CM

## 2015-02-14 DIAGNOSIS — I739 Peripheral vascular disease, unspecified: Secondary | ICD-10-CM | POA: Diagnosis not present

## 2015-02-14 DIAGNOSIS — I8312 Varicose veins of left lower extremity with inflammation: Secondary | ICD-10-CM

## 2015-02-14 MED ORDER — OXYCODONE HCL 5 MG PO TABS: 5.0000 mg | ORAL_TABLET | Freq: Four times a day (QID) | ORAL | Status: AC | PRN

## 2015-02-14 MED ORDER — AZITHROMYCIN 250 MG PO TABS: ORAL_TABLET | ORAL | Status: DC

## 2015-02-14 NOTE — Progress Notes (Signed)
Pre visit review using our clinic review tool, if applicable. No additional management support is needed unless otherwise documented below in the visit note. 

## 2015-02-14 NOTE — Telephone Encounter (Signed)
[facility] called requesting verbal for skilled nursing care due to bilateral edema in lower extremities. Number to call is [phone]

## 2015-02-14 NOTE — Progress Notes (Signed)
Patient ID: Jennifer Olson, female    DOB: Aug 10, 1925  Age: 81 y.o. MRN: [id number]  CC: Follow-up   HPI Jennifer Olson presents for follow up of chronic pain, arthritis, and leg swelling with redness.   1) Set up with Dr. Kirke Corin 02/21/15 new pt appointment   2) Set up with Dr. Metta Clines 02/20/15 new pt appointment   3) Chronic pain- Taking 4 Oxycodone 5 mg daily   4) Right ankle swollen and red, heel painful on that foot, and behind the knee.    Physical Therapist twice a week- next week last 2 visits. Requesting home health RN  History Jennifer Olson has a past medical history of Arthritis; Depression; History of fainting spells of unknown cause; Migraines; Blood transfusion without reported diagnosis; UTI (urinary tract infection); Urinary incontinence; Aneurysm of abdominal aorta (HCC); and Aneurysm artery, iliac (HCC).   Jennifer Olson has past surgical history that includes Breast surgery; Appendectomy (1980); Tonsillectomy and adenoidectomy (1934); Cesarean section; Foot surgery (1976); Knee surgery; and Broken Hip (2015).   Her family history includes Sudden death in her brother.Jennifer Olson reports that Jennifer Olson has never smoked. Jennifer Olson does not have any smokeless tobacco history on file. Jennifer Olson reports that Jennifer Olson does not drink alcohol or use illicit drugs.  No facility-administered medications prior to visit.   Outpatient Prescriptions Prior to Visit  Medication Sig Dispense Refill  . bacitracin ointment Apply to affected area two times per day for 10 days. (Patient not taking: Reported on 02/20/2015) 30 g 0  . busPIRone (BUSPAR) 5 MG tablet Take 1 tablet (5 mg total) by mouth 2 (two) times daily. 60 tablet 2  . ergocalciferol (VITAMIN D2) 50000 UNITS capsule Take 1 capsule (50,000 Units total) by mouth once a week. (Patient taking differently: Take 50,000 Units by mouth once a week. On Monday) 12 capsule 0  . escitalopram (LEXAPRO) 10 MG tablet Take 1 tablet (10 mg total) by mouth daily. 30 tablet 2  . fluticasone (FLONASE)  50 MCG/ACT nasal spray Place 2 sprays into both nostrils daily as needed for allergies or rhinitis.     . furosemide (LASIX) 40 MG tablet Take 1 tablet (40 mg total) by mouth daily. 30 tablet 2  . meloxicam (MOBIC) 15 MG tablet Take 1 tablet (15 mg total) by mouth daily. 30 tablet 2  . pantoprazole (PROTONIX) 20 MG tablet Take 1 tablet (20 mg total) by mouth daily. 30 tablet 2  . Potassium Chloride ER 20 MEQ TBCR Take 40 mEq by mouth daily with breakfast. 60 tablet 2  . predniSONE (DELTASONE) 5 MG tablet Take 1 tablet (5 mg total) by mouth daily with breakfast. 30 tablet 2  . rOPINIRole (REQUIP) 2 MG tablet Take 1 tablet (2 mg total) by mouth 3 (three) times daily. 90 tablet 2  . simvastatin (ZOCOR) 20 MG tablet Take 1 tablet (20 mg total) by mouth at bedtime. 30 tablet 2  . solifenacin (VESICARE) 10 MG tablet Take 1 tablet (10 mg total) by mouth daily. 30 tablet 2  . tamsulosin (FLOMAX) 0.4 MG CAPS capsule Take 1 capsule (0.4 mg total) by mouth at bedtime. 30 capsule 2  . traZODone (DESYREL) 50 MG tablet Take 1 tablet (50 mg total) by mouth at bedtime. 30 tablet 2  . OxyCODONE HCl 7.5 MG TABA Take 2 tablets by mouth 2 (two) times daily. 120 tablet 0  . predniSONE (DELTASONE) 10 MG tablet Take 6 tablets by mouth on day 1 with breakfast then decrease by 1 tablet each day until  gone. 21 tablet 0    ROS Review of Systems  Constitutional: Negative for fever, chills, diaphoresis and fatigue.  Eyes: Negative for visual disturbance.  Respiratory: Negative for cough, chest tightness, shortness of breath and wheezing.   Cardiovascular: Positive for leg swelling. Negative for chest pain and palpitations.  Gastrointestinal: Negative for nausea, vomiting and diarrhea.  Musculoskeletal: Positive for myalgias and back pain.  Skin: Positive for rash.  Neurological: Positive for light-headedness. Negative for dizziness, weakness, numbness and headaches.  Psychiatric/Behavioral: The patient is  nervous/anxious.     Objective:  BP 100/60 mmHg  Pulse 76  Temp(Src) 98.9 F (37.2 C)  Resp 14  Ht 6' (1.829 m)  Wt   SpO2 95%  Physical Exam  Constitutional: Jennifer Olson is oriented to person, place, and time. Jennifer Olson appears well-developed and well-nourished. No distress.  HENT:  Head: Normocephalic and atraumatic.  Right Ear: External ear normal.  Left Ear: External ear normal.  Cardiovascular: Normal rate and regular rhythm.   Pulmonary/Chest: Effort normal and breath sounds normal. No respiratory distress. Jennifer Olson has no wheezes. Jennifer Olson has no rales. Jennifer Olson exhibits no tenderness.  Musculoskeletal: Jennifer Olson exhibits edema and tenderness.  Neurological: Jennifer Olson is alert and oriented to person, place, and time. No cranial nerve deficit. Jennifer Olson exhibits normal muscle tone. Coordination abnormal.  Tenderness especially of right heel that extends to the calf. There is hyperpigmentation, scaling, and blistering of some areas Bilaterally  Unable to pull pulses of lower extremities  Skin: Skin is warm and dry. Rash noted. Jennifer Olson is not diaphoretic. There is erythema.     Scattered annular rashes with clearing centrally located on the back.  Legs are tender, swollen, erythematous, and scaling.  Psychiatric: Jennifer Olson has a normal mood and affect. Her behavior is normal. Judgment and thought content normal.       Assessment & Plan:   Patte was seen today for follow-up.  Diagnoses and all orders for this visit:  Stasis dermatitis of both legs  Chronic pain syndrome  Congestive heart failure, unspecified congestive heart failure chronicity, unspecified congestive heart failure type (HCC)  PVD (peripheral vascular disease) (HCC)  Other orders -     azithromycin (ZITHROMAX) 250 MG tablet; Take 2 tablets by mouth on day 1, take 1 tablet by mouth each day after for 4 days. (Patient not taking: Reported on 02/20/2015) -     oxyCODONE (OXY IR/ROXICODONE) 5 MG immediate release tablet; Take 1 tablet (5 mg total) by  mouth every 6 (six) hours as needed for severe pain. (Patient taking differently: Take 10 mg by mouth 2 (two) times daily. ) -     Flu Vaccine QUAD 36+ mos IM  I have discontinued Jennifer Olson's OxyCODONE HCl. I am also having her start on azithromycin and oxyCODONE. Additionally, I am having her maintain her fluticasone, traZODone, tamsulosin, solifenacin, Potassium Chloride ER, pantoprazole, meloxicam, furosemide, escitalopram, ergocalciferol, predniSONE, rOPINIRole, simvastatin, busPIRone, and bacitracin.  Meds ordered this encounter  Medications  . azithromycin (ZITHROMAX) 250 MG tablet    Sig: Take 2 tablets by mouth on day 1, take 1 tablet by mouth each day after for 4 days.    Dispense:  6 each    Refill:  0    Order Specific Question:  Supervising Provider    Answer:  Duncan DullULLO, TERESA L [[id number]]  . oxyCODONE (OXY IR/ROXICODONE) 5 MG immediate release tablet    Sig: Take 1 tablet (5 mg total) by mouth every 6 (six) hours as needed for severe pain.  Dispense:  120 tablet    Refill:  0    Order Specific Question:  Supervising Provider    Answer:  Sherlene Shams [[id number]]     Follow-up: Return in about 4 weeks (around 03/14/2015) for Follow up .

## 2015-02-14 NOTE — Patient Instructions (Addendum)
Address: [address], [place], [place] [address]  Phone: [phone] Dr. Kirke CorinArida- Cardiology   Z-pack 2 tablets day 1 and 1 tablet day 2-5. If not helpful afterwards or worsening we will get an Ultrasound.   Cut lasix in half and check blood pressures.   Keep legs elevated as much as possible.

## 2015-02-17 ENCOUNTER — Encounter: Payer: Self-pay | Admitting: Nurse Practitioner

## 2015-02-19 ENCOUNTER — Telehealth: Payer: Self-pay | Admitting: Nurse Practitioner

## 2015-02-19 NOTE — Telephone Encounter (Signed)
Jennifer RockerGaye Olson left a voice mail stating that the patient saw Lyla SonCarrie last Friday and was given Z pack for swollen legs . She was instructed to call back if she was no better. Her legs per the caller look like they are about to burst. . Needing to know what to do.

## 2015-02-19 NOTE — Telephone Encounter (Signed)
Please advise 

## 2015-02-20 ENCOUNTER — Ambulatory Visit: Payer: Medicare Other | Admitting: Pain Medicine

## 2015-02-20 ENCOUNTER — Inpatient Hospital Stay
Admission: EM | Admit: 2015-02-20 | Discharge: 2015-02-24 | DRG: 481 | Disposition: A | Discharge status: Skilled Nursing Facility | Payer: Medicare Other | Attending: Internal Medicine | Admitting: Internal Medicine

## 2015-02-20 ENCOUNTER — Emergency Department: Payer: Medicare Other

## 2015-02-20 ENCOUNTER — Encounter: Payer: Self-pay | Admitting: Medical Oncology

## 2015-02-20 DIAGNOSIS — M479 Spondylosis, unspecified: Secondary | ICD-10-CM | POA: Diagnosis present

## 2015-02-20 DIAGNOSIS — E869 Volume depletion, unspecified: Secondary | ICD-10-CM | POA: Diagnosis present

## 2015-02-20 DIAGNOSIS — D62 Acute posthemorrhagic anemia: Secondary | ICD-10-CM | POA: Diagnosis not present

## 2015-02-20 DIAGNOSIS — I10 Essential (primary) hypertension: Secondary | ICD-10-CM | POA: Diagnosis not present

## 2015-02-20 DIAGNOSIS — I714 Abdominal aortic aneurysm, without rupture: Secondary | ICD-10-CM | POA: Diagnosis present

## 2015-02-20 DIAGNOSIS — Y92009 Unspecified place in unspecified non-institutional (private) residence as the place of occurrence of the external cause: Secondary | ICD-10-CM

## 2015-02-20 DIAGNOSIS — Z66 Do not resuscitate: Secondary | ICD-10-CM | POA: Diagnosis present

## 2015-02-20 DIAGNOSIS — S72002A Fracture of unspecified part of neck of left femur, initial encounter for closed fracture: Secondary | ICD-10-CM

## 2015-02-20 DIAGNOSIS — N179 Acute kidney failure, unspecified: Secondary | ICD-10-CM | POA: Diagnosis present

## 2015-02-20 DIAGNOSIS — F329 Major depressive disorder, single episode, unspecified: Secondary | ICD-10-CM | POA: Diagnosis present

## 2015-02-20 DIAGNOSIS — M549 Dorsalgia, unspecified: Secondary | ICD-10-CM

## 2015-02-20 DIAGNOSIS — Y929 Unspecified place or not applicable: Secondary | ICD-10-CM | POA: Diagnosis not present

## 2015-02-20 DIAGNOSIS — S72009A Fracture of unspecified part of neck of unspecified femur, initial encounter for closed fracture: Secondary | ICD-10-CM | POA: Diagnosis present

## 2015-02-20 DIAGNOSIS — K219 Gastro-esophageal reflux disease without esophagitis: Secondary | ICD-10-CM | POA: Diagnosis present

## 2015-02-20 DIAGNOSIS — J449 Chronic obstructive pulmonary disease, unspecified: Secondary | ICD-10-CM | POA: Diagnosis present

## 2015-02-20 DIAGNOSIS — M545 Low back pain: Secondary | ICD-10-CM | POA: Diagnosis present

## 2015-02-20 DIAGNOSIS — Z880 Allergy status to penicillin: Secondary | ICD-10-CM

## 2015-02-20 DIAGNOSIS — I9581 Postprocedural hypotension: Secondary | ICD-10-CM | POA: Diagnosis not present

## 2015-02-20 DIAGNOSIS — S72142A Displaced intertrochanteric fracture of left femur, initial encounter for closed fracture: Principal | ICD-10-CM | POA: Diagnosis present

## 2015-02-20 DIAGNOSIS — Z79891 Long term (current) use of opiate analgesic: Secondary | ICD-10-CM

## 2015-02-20 DIAGNOSIS — I739 Peripheral vascular disease, unspecified: Secondary | ICD-10-CM | POA: Diagnosis present

## 2015-02-20 DIAGNOSIS — E785 Hyperlipidemia, unspecified: Secondary | ICD-10-CM | POA: Diagnosis present

## 2015-02-20 DIAGNOSIS — Z01818 Encounter for other preprocedural examination: Secondary | ICD-10-CM | POA: Diagnosis not present

## 2015-02-20 DIAGNOSIS — Z7952 Long term (current) use of systemic steroids: Secondary | ICD-10-CM | POA: Diagnosis not present

## 2015-02-20 DIAGNOSIS — G8929 Other chronic pain: Secondary | ICD-10-CM | POA: Diagnosis present

## 2015-02-20 DIAGNOSIS — W19XXXA Unspecified fall, initial encounter: Secondary | ICD-10-CM | POA: Diagnosis present

## 2015-02-20 DIAGNOSIS — F419 Anxiety disorder, unspecified: Secondary | ICD-10-CM | POA: Diagnosis present

## 2015-02-20 DIAGNOSIS — L03115 Cellulitis of right lower limb: Secondary | ICD-10-CM | POA: Diagnosis present

## 2015-02-20 DIAGNOSIS — Z79899 Other long term (current) drug therapy: Secondary | ICD-10-CM | POA: Diagnosis not present

## 2015-02-20 DIAGNOSIS — Z96653 Presence of artificial knee joint, bilateral: Secondary | ICD-10-CM | POA: Diagnosis present

## 2015-02-20 DIAGNOSIS — D649 Anemia, unspecified: Secondary | ICD-10-CM | POA: Diagnosis not present

## 2015-02-20 DIAGNOSIS — R609 Edema, unspecified: Secondary | ICD-10-CM

## 2015-02-20 DIAGNOSIS — T148XXA Other injury of unspecified body region, initial encounter: Secondary | ICD-10-CM

## 2015-02-20 LAB — URINALYSIS COMPLETE WITH MICROSCOPIC (ARMC ONLY)
Bacteria, UA: NONE SEEN
Bilirubin Urine: NEGATIVE
GLUCOSE, UA: NEGATIVE mg/dL
Hgb urine dipstick: NEGATIVE
KETONES UR: NEGATIVE mg/dL
Leukocytes, UA: NEGATIVE
Nitrite: NEGATIVE
Protein, ur: NEGATIVE mg/dL
Specific Gravity, Urine: 1.005 (ref 1.005–1.030)
Squamous Epithelial / LPF: NONE SEEN
pH: 8 (ref 5.0–8.0)

## 2015-02-20 LAB — MAGNESIUM: Magnesium: 1.9 mg/dL (ref 1.7–2.4)

## 2015-02-20 LAB — CBC WITH DIFFERENTIAL/PLATELET
Basophils Absolute: 0.1 10*3/uL (ref 0–0.1)
Basophils Relative: 1 %
EOS ABS: 0 10*3/uL (ref 0–0.7)
EOS PCT: 0 %
HCT: 39.7 % (ref 35.0–47.0)
Hemoglobin: 13.3 g/dL (ref 12.0–16.0)
LYMPHS ABS: 0.8 10*3/uL — AB (ref 1.0–3.6)
Lymphocytes Relative: 9 %
MCH: 32.4 pg (ref 26.0–34.0)
MCHC: 33.4 g/dL (ref 32.0–36.0)
MCV: 97.1 fL (ref 80.0–100.0)
MONO ABS: 0.4 10*3/uL (ref 0.2–0.9)
MONOS PCT: 5 %
Neutro Abs: 7.6 10*3/uL — ABNORMAL HIGH (ref 1.4–6.5)
Neutrophils Relative %: 85 %
PLATELETS: 254 10*3/uL (ref 150–440)
RBC: 4.09 MIL/uL (ref 3.80–5.20)
RDW: 13.8 % (ref 11.5–14.5)
WBC: 8.9 10*3/uL (ref 3.6–11.0)

## 2015-02-20 LAB — COMPREHENSIVE METABOLIC PANEL
ALBUMIN: 3.5 g/dL (ref 3.5–5.0)
ALK PHOS: 197 U/L — AB (ref 38–126)
ALT: 14 U/L (ref 14–54)
ANION GAP: 11 (ref 5–15)
AST: 23 U/L (ref 15–41)
BILIRUBIN TOTAL: 0.6 mg/dL (ref 0.3–1.2)
BUN: 22 mg/dL — AB (ref 6–20)
CALCIUM: 8.9 mg/dL (ref 8.9–10.3)
CO2: 33 mmol/L — ABNORMAL HIGH (ref 22–32)
Chloride: 97 mmol/L — ABNORMAL LOW (ref 101–111)
Creatinine, Ser: 1.28 mg/dL — ABNORMAL HIGH (ref 0.44–1.00)
GFR calc Af Amer: 42 mL/min — ABNORMAL LOW (ref >60)
GFR calc non Af Amer: 36 mL/min — ABNORMAL LOW (ref >60)
Glucose, Bld: 147 mg/dL — ABNORMAL HIGH (ref 65–99)
Potassium: 4.1 mmol/L (ref 3.5–5.1)
Sodium: 141 mmol/L (ref 135–145)
TOTAL PROTEIN: 6.8 g/dL (ref 6.5–8.1)

## 2015-02-20 LAB — APTT: aPTT: 27 seconds (ref 24–36)

## 2015-02-20 LAB — PROTIME-INR
INR: 0.98
PROTHROMBIN TIME: 13.2 s (ref 11.4–15.0)

## 2015-02-20 LAB — LACTIC ACID, PLASMA
Lactic Acid, Venous: 1.7 mmol/L (ref 0.5–2.0)
Lactic Acid, Venous: 1.8 mmol/L (ref 0.5–2.0)

## 2015-02-20 LAB — MRSA PCR SCREENING: MRSA BY PCR: NEGATIVE

## 2015-02-20 LAB — TROPONIN I: TROPONIN I: 0.03 ng/mL (ref <0.031)

## 2015-02-20 MED ORDER — SODIUM CHLORIDE 0.9 % IV BOLUS (SEPSIS): 500.0000 mL | INTRAVENOUS | Status: DC

## 2015-02-20 MED ORDER — DARIFENACIN HYDROBROMIDE ER 7.5 MG PO TB24
7.5000 mg | ORAL_TABLET | Freq: Every day | ORAL | Status: DC
  Administered 2015-02-20 – 2015-02-24 (×4): 7.5 mg via ORAL
  Filled 2015-02-20 (×4): qty 1

## 2015-02-20 MED ORDER — TAMSULOSIN HCL 0.4 MG PO CAPS
0.4000 mg | ORAL_CAPSULE | Freq: Every day | ORAL | Status: DC
  Administered 2015-02-20 – 2015-02-23 (×4): 0.4 mg via ORAL
  Filled 2015-02-20 (×4): qty 1

## 2015-02-20 MED ORDER — OXYCODONE HCL 5 MG PO TABS: 5.0000 mg | ORAL_TABLET | ORAL | Status: DC | PRN

## 2015-02-20 MED ORDER — ESCITALOPRAM OXALATE 10 MG PO TABS
10.0000 mg | ORAL_TABLET | Freq: Every day | ORAL | Status: DC
  Administered 2015-02-20 – 2015-02-24 (×4): 10 mg via ORAL
  Filled 2015-02-20 (×4): qty 1

## 2015-02-20 MED ORDER — CLINDAMYCIN PHOSPHATE 600 MG/50ML IV SOLN
600.0000 mg | Freq: Three times a day (TID) | INTRAVENOUS | Status: DC
  Administered 2015-02-20 – 2015-02-21 (×4): 600 mg via INTRAVENOUS
  Filled 2015-02-20 (×5): qty 50

## 2015-02-20 MED ORDER — ONDANSETRON HCL 4 MG/2ML IJ SOLN
4.0000 mg | Freq: Once | INTRAMUSCULAR | Status: AC
  Administered 2015-02-20: 4 mg via INTRAVENOUS
  Filled 2015-02-20: qty 2

## 2015-02-20 MED ORDER — MORPHINE SULFATE (PF) 2 MG/ML IV SOLN
2.0000 mg | INTRAVENOUS | Status: DC | PRN
  Administered 2015-02-21: 2 mg via INTRAVENOUS
  Filled 2015-02-20 (×2): qty 1

## 2015-02-20 MED ORDER — HYDROCODONE-ACETAMINOPHEN 5-325 MG PO TABS
1.0000 | ORAL_TABLET | Freq: Four times a day (QID) | ORAL | Status: DC | PRN
  Administered 2015-02-20: 1 via ORAL
  Filled 2015-02-20: qty 1

## 2015-02-20 MED ORDER — CEFTRIAXONE SODIUM 1 G IJ SOLR
1.0000 g | INTRAMUSCULAR | Status: AC
  Administered 2015-02-20: 1 g via INTRAVENOUS
  Filled 2015-02-20: qty 10

## 2015-02-20 MED ORDER — SODIUM CHLORIDE 0.9 % IV SOLN
INTRAVENOUS | Status: DC
  Administered 2015-02-20 – 2015-02-21 (×2): via INTRAVENOUS

## 2015-02-20 MED ORDER — MORPHINE SULFATE (PF) 4 MG/ML IV SOLN
4.0000 mg | Freq: Once | INTRAVENOUS | Status: AC
  Administered 2015-02-20: 4 mg via INTRAVENOUS
  Filled 2015-02-20: qty 1

## 2015-02-20 MED ORDER — ROPINIROLE HCL 1 MG PO TABS
2.0000 mg | ORAL_TABLET | Freq: Three times a day (TID) | ORAL | Status: DC
  Administered 2015-02-20 – 2015-02-24 (×9): 2 mg via ORAL
  Filled 2015-02-20 (×12): qty 2

## 2015-02-20 MED ORDER — SODIUM CHLORIDE 0.9 % IV SOLN
1000.0000 mL | INTRAVENOUS | Status: DC
  Administered 2015-02-20: 1000 mL via INTRAVENOUS

## 2015-02-20 MED ORDER — ENOXAPARIN SODIUM 40 MG/0.4ML ~~LOC~~ SOLN: 40.0000 mg | SUBCUTANEOUS | Status: DC

## 2015-02-20 MED ORDER — TRAZODONE HCL 50 MG PO TABS
50.0000 mg | ORAL_TABLET | Freq: Every day | ORAL | Status: DC
  Administered 2015-02-20 – 2015-02-23 (×4): 50 mg via ORAL
  Filled 2015-02-20 (×4): qty 1

## 2015-02-20 MED ORDER — PANTOPRAZOLE SODIUM 40 MG PO TBEC
40.0000 mg | DELAYED_RELEASE_TABLET | Freq: Every day | ORAL | Status: DC
  Administered 2015-02-20 – 2015-02-24 (×4): 40 mg via ORAL
  Filled 2015-02-20 (×4): qty 1

## 2015-02-20 MED ORDER — OXYCODONE HCL 5 MG PO TABS
5.0000 mg | ORAL_TABLET | ORAL | Status: DC | PRN
  Administered 2015-02-20: 5 mg via ORAL
  Filled 2015-02-20: qty 1

## 2015-02-20 MED ORDER — ZOLPIDEM TARTRATE 5 MG PO TABS: 5.0000 mg | ORAL_TABLET | Freq: Every evening | ORAL | Status: DC | PRN

## 2015-02-20 MED ORDER — VANCOMYCIN HCL IN DEXTROSE 1-5 GM/200ML-% IV SOLN
1000.0000 mg | Freq: Once | INTRAVENOUS | Status: AC
  Administered 2015-02-20: 1000 mg via INTRAVENOUS
  Filled 2015-02-20: qty 200

## 2015-02-20 MED ORDER — FLUTICASONE PROPIONATE 50 MCG/ACT NA SUSP
2.0000 | Freq: Every day | NASAL | Status: DC | PRN
  Filled 2015-02-20: qty 16

## 2015-02-20 MED ORDER — DOCUSATE SODIUM 100 MG PO CAPS
100.0000 mg | ORAL_CAPSULE | Freq: Two times a day (BID) | ORAL | Status: DC
  Administered 2015-02-20: 100 mg via ORAL
  Filled 2015-02-20: qty 1

## 2015-02-20 MED ORDER — SIMVASTATIN 20 MG PO TABS
20.0000 mg | ORAL_TABLET | Freq: Every day | ORAL | Status: DC
  Administered 2015-02-20 – 2015-02-23 (×4): 20 mg via ORAL
  Filled 2015-02-20 (×4): qty 1

## 2015-02-20 MED ORDER — MORPHINE SULFATE (PF) 2 MG/ML IV SOLN: 0.5000 mg | INTRAVENOUS | Status: DC | PRN

## 2015-02-20 MED ORDER — OXYCODONE HCL 5 MG PO TABS: 5.0000 mg | ORAL_TABLET | Freq: Four times a day (QID) | ORAL | Status: DC | PRN

## 2015-02-20 MED ORDER — BUSPIRONE HCL 5 MG PO TABS
5.0000 mg | ORAL_TABLET | Freq: Two times a day (BID) | ORAL | Status: DC
  Administered 2015-02-20 – 2015-02-24 (×7): 5 mg via ORAL
  Filled 2015-02-20 (×7): qty 1

## 2015-02-20 MED ORDER — SENNA 8.6 MG PO TABS
1.0000 | ORAL_TABLET | Freq: Two times a day (BID) | ORAL | Status: DC
  Administered 2015-02-20: 8.6 mg via ORAL
  Filled 2015-02-20: qty 1

## 2015-02-20 MED ORDER — SODIUM CHLORIDE 0.9 % IV BOLUS (SEPSIS)
1000.0000 mL | Freq: Once | INTRAVENOUS | Status: AC
  Administered 2015-02-20: 1000 mL via INTRAVENOUS

## 2015-02-20 NOTE — ED Provider Notes (Signed)
[facility] Emergency Department Provider Note  ____________________________________________  Time seen: Approximately 2:52 PM  I have reviewed the triage vital signs and the nursing notes.   HISTORY  Chief Complaint Fall and Hip Pain    HPI Jennifer Olson is a 81 y.o. female who presents by EMS with pain in her left hip after having a mechanical fall.  She tripped on an area rug today at home and fell, landing on her left hip but sustaining no other injuries.  She did not hit her head, did not lose consciousness, and has no neck pain.  She has severe pain in her left hip at rest and even worse with any movement of the left lower extremity.She has no numbness or tingling distally in the leg.  She denies fever/chills, chest pain, shortness of breath, abdominal pain.  She has no injuries of any other extremity.  Of note, she is currently taking azithromycin prescribed by her outpatient doctor for redness in her right lower extremity consistent with cellulitis.  Her daughter reports that it has not gotten any better and since this morning it appears more red, more swollen, and that the redness has spread to a larger area of the leg.  The patient cannot identify whether or not it feels worse to her at this time given the pain from her traumatic injury.   Past Medical History  Diagnosis Date  . Arthritis   . Depression   . History of fainting spells of unknown cause   . Migraines   . Blood transfusion without reported diagnosis   . UTI (urinary tract infection)   . Urinary incontinence   . Aneurysm of abdominal aorta (HCC)   . 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 Chronic pain syndrome 01/21/2015  . Depression with anxiety 01/02/2015  . Encounter to establish care 01/02/2015  . Sleep disturbance 01/02/2015  . Urinary incontinence 01/02/2015  . Hyperlipidemia 01/02/2015  . GERD (gastroesophageal reflux  disease) 01/02/2015  . Arthritis 01/02/2015  . Congestive heart failure (HCC) 01/02/2015  . Vitamin D deficiency 01/02/2015  . COPD (chronic obstructive pulmonary disease) (HCC) 01/02/2015    Past Surgical History  Procedure Laterality Date  . Breast surgery      Biopsy  . Appendectomy  1980  . Tonsillectomy and adenoidectomy  1934  . Cesarean section      1956/1962  . Foot surgery  1976    Both Feet  . Knee surgery      Both Knees/1999/2000  . Broken hip  2015    Right Hip    Current Outpatient Rx  Name  Route  Sig  Dispense  Refill  . bacitracin ointment      Apply to affected area two times per day for 10 days.   30 g   0   . busPIRone (BUSPAR) 5 MG tablet   Oral   Take 1 tablet (5 mg total) by mouth 2 (two) times daily.   60 tablet   2   . ergocalciferol (VITAMIN D2) 50000 UNITS capsule   Oral   Take 1 capsule (50,000 Units total) by mouth once a week. Patient taking differently: Take 50,000 Units by mouth once a week. On Monday   12 capsule   0   . escitalopram (LEXAPRO) 10 MG tablet   Oral   Take 1 tablet (10 mg total) by mouth daily.   30 tablet  2   . fluticasone (FLONASE) 50 MCG/ACT nasal spray   Each Nare   Place 2 sprays into both nostrils daily as needed for allergies or rhinitis.          . furosemide (LASIX) 40 MG tablet   Oral   Take 1 tablet (40 mg total) by mouth daily.   30 tablet   2   . meloxicam (MOBIC) 15 MG tablet   Oral   Take 1 tablet (15 mg total) by mouth daily.   30 tablet   2   . oxyCODONE (OXY IR/ROXICODONE) 5 MG immediate release tablet   Oral   Take 1 tablet (5 mg total) by mouth every 6 (six) hours as needed for severe pain. Patient taking differently: Take 10 mg by mouth 2 (two) times daily.    120 tablet   0   . pantoprazole (PROTONIX) 20 MG tablet   Oral   Take 1 tablet (20 mg total) by mouth daily.   30 tablet   2   . Potassium Chloride ER 20 MEQ TBCR   Oral   Take 40 mEq by mouth daily with  breakfast.   60 tablet   2   . predniSONE (DELTASONE) 5 MG tablet   Oral   Take 1 tablet (5 mg total) by mouth daily with breakfast.   30 tablet   2   . rOPINIRole (REQUIP) 2 MG tablet   Oral   Take 1 tablet (2 mg total) by mouth 3 (three) times daily.   90 tablet   2   . simvastatin (ZOCOR) 20 MG tablet   Oral   Take 1 tablet (20 mg total) by mouth at bedtime.   30 tablet   2   . solifenacin (VESICARE) 10 MG tablet   Oral   Take 1 tablet (10 mg total) by mouth daily.   30 tablet   2   . tamsulosin (FLOMAX) 0.4 MG CAPS capsule   Oral   Take 1 capsule (0.4 mg total) by mouth at bedtime.   30 capsule   2   . traZODone (DESYREL) 50 MG tablet   Oral   Take 1 tablet (50 mg total) by mouth at bedtime.   30 tablet   2   . azithromycin (ZITHROMAX) 250 MG tablet      Take 2 tablets by mouth on day 1, take 1 tablet by mouth each day after for 4 days. Patient not taking: Reported on 02/20/2015   6 each   0     Allergies Penicillins  Family History  Problem Relation Age of Onset  . Sudden death Brother     Social History Social History  Substance Use Topics  . Smoking status: Never Smoker   . Smokeless tobacco: None  . Alcohol Use: No    Review of Systems Constitutional: No fever/chills Eyes: No visual changes. ENT: No sore throat. Cardiovascular: Denies chest pain. Respiratory: Denies shortness of breath. Gastrointestinal: No abdominal pain.  No nausea, no vomiting.  No diarrhea.  No constipation. Genitourinary: Negative for dysuria. Musculoskeletal: Severe pain in the left hip after a mechanical fall. Skin: Worsening redness, swelling, and warmth in the right lower extremity from the knee down to the foot in spite of outpatient antibiotics (azithromycin). Neurological: Negative for headaches, focal weakness or numbness.  10-point ROS otherwise negative.  ____________________________________________   PHYSICAL EXAM:  VITAL SIGNS: ED Triage  Vitals  Enc Vitals Group     BP 02/20/15 1435  129/77 mmHg     Pulse Rate 02/20/15 1435 80     Resp 02/20/15 1435 18     Temp 02/20/15 1435 98.4 F (36.9 C)     Temp Source 02/20/15 1435 Oral     SpO2 02/20/15 1435 97 %     Weight 02/20/15 1435 217 lb (98.431 kg)     Height 02/20/15 1435 5\' 5"  (1.651 m)     Head Cir --      Peak Flow --      Pain Score 02/20/15 1436 10     Pain Loc --      Pain Edu? --      Excl. in GC? --     Constitutional: Alert and oriented.  Appears to be in pain but otherwise in no distress Eyes: Conjunctivae are normal. PERRL. EOMI. Head: Atraumatic. Nose: No congestion/rhinnorhea. Mouth/Throat: Mucous membranes are moist.  Oropharynx non-erythematous. Neck: No stridor.  No cervical spine tenderness to palpation. Cardiovascular: Normal rate, regular rhythm. Grossly normal heart sounds.  Good peripheral circulation. Respiratory: Normal respiratory effort.  No retractions. Lungs CTAB. Gastrointestinal: Soft and nontender. No distention. No abdominal bruits. No CVA tenderness. Musculoskeletal: External rotation and shortening of the left lower extremity.  Sensation is intact throughout and pulse is dopplerable in the left foot.  Her right lower extremity from below the knee running distally is 1+ pitting edema, erythematous, and very warm to the touch consistent with cellulitis.  It is mildly tender to palpation but the left lower extremity is causing a distracting injury Neurologic:  Normal speech and language. No gross focal neurologic deficits are appreciated.  Skin:  Skin is warm, dry and intact.  Cellulitis of the right lower extremity as described above Psychiatric: Mood and affect are normal. Speech and behavior are normal.  ____________________________________________   LABS (all labs ordered are listed, but only abnormal results are displayed)  Labs Reviewed  COMPREHENSIVE METABOLIC PANEL - Abnormal; Notable for the following:    Chloride 97 (*)     CO2 33 (*)    Glucose, Bld 147 (*)    BUN 22 (*)    Creatinine, Ser 1.28 (*)    Alkaline Phosphatase 197 (*)    GFR calc non Af Amer 36 (*)    GFR calc Af Amer 42 (*)    All other components within normal limits  CBC WITH DIFFERENTIAL/PLATELET - Abnormal; Notable for the following:    Neutro Abs 7.6 (*)    Lymphs Abs 0.8 (*)    All other components within normal limits  CULTURE, BLOOD (ROUTINE X 2)  CULTURE, BLOOD (ROUTINE X 2)  PROTIME-INR  APTT  LACTIC ACID, PLASMA  TROPONIN I  MAGNESIUM  LACTIC ACID, PLASMA  URINALYSIS COMPLETEWITH MICROSCOPIC ([facility] ONLY)  TYPE AND SCREEN  ABO/RH   ____________________________________________  EKG  ED ECG REPORT I, Harshil Cavallaro, the attending physician, personally viewed and interpreted this ECG.  Date: 02/20/2015 EKG Time: 15:11 Rate: 75 Rhythm: normal sinus rhythm with 1st degree AV block QRS Axis: normal Intervals: prolonged PR interval, LBBB ST/T Wave abnormalities: normal Conduction Disutrbances: none Narrative Interpretation: No evidence of acute ischemia; no old ECG to compare for LBBB  ____________________________________________  RADIOLOGY   Dg Chest Port 1 View  02/20/2015  CLINICAL DATA:  Tripped over area rug and fell at home. Left femur fracture. Preop. EXAM: PORTABLE CHEST 1 VIEW COMPARISON:  None. FINDINGS: The cardiac silhouette is upper limits of normal to mildly enlarged in size. Thoracic aortic  calcification is noted. No airspace consolidation, edema, pleural effusion, or pneumothorax is identified. There is minimal left basilar scarring. Moderate thoracic dextroscoliosis is present. IMPRESSION: 1. No evidence of acute airspace disease. 2. Borderline cardiomegaly. 3. Moderate thoracic dextroscoliosis. Electronically Signed   By: Sebastian AcheAllen  Grady M.D.   On: 02/20/2015 15:43   Dg Hip Unilat With Pelvis 2-3 Views Left  02/20/2015  CLINICAL DATA:  Fall with left hip pain. EXAM: DG HIP (WITH OR WITHOUT PELVIS)  2-3V LEFT COMPARISON:  Pelvis 01/16/2015 FINDINGS: There is a displaced and mildly angulated left femoral intertrochanteric fracture. Limited evaluation of the left femoral neck. Left femoral head appears to be located. Stable postsurgical changes in the proximal right femur with an intramedullary rod and hip screw. Again noted is an aortic stent graft with vascular coils in the left internal iliac artery region. Pelvic bony ring appears to be intact. IMPRESSION: Left femoral intertrochanteric fracture. Electronically Signed   By: Richarda OverlieAdam  Henn M.D.   On: 02/20/2015 15:45    ____________________________________________   PROCEDURES  Procedure(s) performed: None  Critical Care performed: No ____________________________________________   INITIAL IMPRESSION / ASSESSMENT AND PLAN / ED COURSE  Pertinent labs & imaging results that were available during my care of the patient were reviewed by me and considered in my medical decision making (see chart for details).  The patient will certainly has a left hip fracture based on her clinical presentation but she is neurovascularly intact at this time.  I will provide analgesia and antiemetics and make her nothing by mouth at this time.  Additionally, I am worried about the cellulitis of the right lower extremity which is getting worse in spite of azithromycin which was apparently prescribed to treat outpatient cellulitis.  I am not concerned at this point about a DVT of the right lower extremity as cellulitis is much more consistent with the pattern that I am seeing in the outpatient course.  I will work her up from an infectious standpoint with a lactate, blood cultures, and empiric antibiotics after cultures are obtained (ceftriaxone 1 g IV and vancomycin 1 g IV).  8 consult orthopedics and admitting to the hospitalist once the workup is complete.  I am also starting with a small fluid bolus because she does have a history of CHF and she does not appear  septic at this time.  ----------------------------------------- 3:56 PM on 02/20/2015 -----------------------------------------  Intertrochanteric femur fracture, paged Dr. Martha ClanKrasinski.  ----------------------------------------- 4:30 PM on 02/20/2015 -----------------------------------------  Spoke by phone with Dr. Martha ClanKrasinski who will see the patient after he finishes in clinic today.  He asked me to admit to the hospitalist which I will do.  I will update them about the cellulitis as well as the intertrochanteric hip fracture.  The patient does not meet sepsis criteria but the initial antibiotic she received are appropriate.  ____________________________________________  FINAL CLINICAL IMPRESSION(S) / ED DIAGNOSES  Final diagnoses:  Closed left hip fracture, initial encounter (HCC)  Cellulitis of right lower extremity without foot      NEW MEDICATIONS STARTED DURING THIS VISIT:  New Prescriptions   No medications on file     Loleta Roseory Teira Arcilla, MD 02/20/15 [phone]

## 2015-02-20 NOTE — ED Notes (Signed)
Pt from home via ems with reports that she tripped over an area rug and is now having left hip pain with rotation noted and also lower back pain. Denies hitting head or LOC. A/O x 4.

## 2015-02-20 NOTE — Telephone Encounter (Signed)
Sounds good to me. Glad they are improving and as long as someone who is medical can look at them I am okay with this. Thanks for letting me know!

## 2015-02-20 NOTE — H&P (Addendum)
[facility]   PATIENT NAME: Jennifer Olson    MR#:  [id number]  DATE OF BIRTH:  [birth year]  DATE OF ADMISSION:  02/20/2015  PRIMARY CARE PHYSICIAN: Carollee Leitz, NP   REQUESTING/REFERRING PHYSICIAN: Loleta Rose, MD  CHIEF COMPLAINT:   Chief Complaint  Patient presents with  . Fall  . Hip Pain   HISTORY OF PRESENT ILLNESS:  Jennifer Olson  is a 81 y.o. female with a known history of arthritis, depression is being admitted for left hip fracture status post mechanical fall twice today. She tripped on an area rug today while trying to get her walker at home and fell, landing on her left hip but sustaining no other injuries. She did not hit her head, did not lose consciousness, and has no neck pain. She has severe pain in her left hip at rest and even worse with any movement of the left lower extremity.She has no numbness or tingling distally in the leg. She denies fever/chills, chest pain, shortness of breath, abdominal pain. She has no injuries of any other extremity.  Of note, she is currently taking azithromycin prescribed by her outpatient doctor for redness in her right lower extremity consistent with cellulitis. Her daughter reports that it has not gotten any better and since this morning it appears more red, more swollen, and that the redness has spread to a larger area of the leg. The patient cannot identify whether or not it feels worse to her at this time given the pain from her traumatic injury. PAST MEDICAL HISTORY:   Past Medical History  Diagnosis Date  . Arthritis   . Depression   . History of fainting spells of unknown cause   . Migraines   . Blood transfusion without reported diagnosis   . UTI (urinary tract infection)   . Urinary incontinence   . Aneurysm of abdominal aorta (HCC)   . Aneurysm artery, iliac (HCC)    PAST SURGICAL HISTORY:   Past Surgical History  Procedure Laterality Date  . Breast surgery       Biopsy  . Appendectomy  1980  . Tonsillectomy and adenoidectomy  1934  . Cesarean section      1956/1962  . Foot surgery  1976    Both Feet  . Knee surgery      Both Knees/1999/2000  . Broken hip  2015    Right Hip   SOCIAL HISTORY:   Social History  Substance Use Topics  . Smoking status: Never Smoker   . Smokeless tobacco: Not on file  . Alcohol Use: No   FAMILY HISTORY:   Family History  Problem Relation Age of Onset  . Sudden death Brother    DRUG ALLERGIES:   Allergies  Allergen Reactions  . Penicillins Rash   REVIEW OF SYSTEMS:   Review of Systems  Constitutional: Negative for fever, weight loss, malaise/fatigue and diaphoresis.  HENT: Negative for ear discharge, ear pain, hearing loss, nosebleeds, sore throat and tinnitus.   Eyes: Negative for blurred vision and pain.  Respiratory: Negative for cough, hemoptysis, shortness of breath and wheezing.   Cardiovascular: Negative for chest pain, palpitations, orthopnea and leg swelling.  Gastrointestinal: Negative for heartburn, nausea, vomiting, abdominal pain, diarrhea, constipation and blood in stool.  Genitourinary: Negative for dysuria, urgency and frequency.  Musculoskeletal: Positive for joint pain (lt hip pain) and falls. Negative for myalgias and back pain.  Skin: Positive for rash. Negative for itching.  Neurological: Negative  for dizziness, tingling, tremors, focal weakness, seizures, weakness and headaches.  Psychiatric/Behavioral: Negative for depression. The patient is not nervous/anxious.    MEDICATIONS AT HOME:   Prior to Admission medications   Medication Sig Start Date End Date Taking? Authorizing Provider  bacitracin ointment Apply to affected area two times per day for 10 days. 01/16/15 01/16/16 Yes Myrna Blazeravid Matthew Schaevitz, MD  busPIRone (BUSPAR) 5 MG tablet Take 1 tablet (5 mg total) by mouth 2 (two) times daily. 01/02/15  Yes Carollee Leitzarrie M Doss, NP  ergocalciferol (VITAMIN D2) 50000 UNITS capsule  Take 1 capsule (50,000 Units total) by mouth once a week. Patient taking differently: Take 50,000 Units by mouth once a week. On Monday 01/02/15  Yes Carollee Leitzarrie M Doss, NP  escitalopram (LEXAPRO) 10 MG tablet Take 1 tablet (10 mg total) by mouth daily. 01/02/15  Yes Carollee Leitzarrie M Doss, NP  fluticasone (FLONASE) 50 MCG/ACT nasal spray Place 2 sprays into both nostrils daily as needed for allergies or rhinitis.    Yes Historical Provider, MD  furosemide (LASIX) 40 MG tablet Take 1 tablet (40 mg total) by mouth daily. 01/02/15  Yes Carollee Leitzarrie M Doss, NP  meloxicam (MOBIC) 15 MG tablet Take 1 tablet (15 mg total) by mouth daily. 01/02/15  Yes Carollee Leitzarrie M Doss, NP  oxyCODONE (OXY IR/ROXICODONE) 5 MG immediate release tablet Take 1 tablet (5 mg total) by mouth every 6 (six) hours as needed for severe pain. Patient taking differently: Take 10 mg by mouth 2 (two) times daily.  02/14/15  Yes Carollee Leitzarrie M Doss, NP  pantoprazole (PROTONIX) 20 MG tablet Take 1 tablet (20 mg total) by mouth daily. 01/02/15  Yes Carollee Leitzarrie M Doss, NP  Potassium Chloride ER 20 MEQ TBCR Take 40 mEq by mouth daily with breakfast. 01/02/15  Yes Carollee Leitzarrie M Doss, NP  predniSONE (DELTASONE) 5 MG tablet Take 1 tablet (5 mg total) by mouth daily with breakfast. 01/02/15  Yes Carollee Leitzarrie M Doss, NP  rOPINIRole (REQUIP) 2 MG tablet Take 1 tablet (2 mg total) by mouth 3 (three) times daily. 01/02/15  Yes Carollee Leitzarrie M Doss, NP  simvastatin (ZOCOR) 20 MG tablet Take 1 tablet (20 mg total) by mouth at bedtime. 01/02/15  Yes Carollee Leitzarrie M Doss, NP  solifenacin (VESICARE) 10 MG tablet Take 1 tablet (10 mg total) by mouth daily. 01/02/15  Yes Carollee Leitzarrie M Doss, NP  tamsulosin (FLOMAX) 0.4 MG CAPS capsule Take 1 capsule (0.4 mg total) by mouth at bedtime. 01/02/15  Yes Carollee Leitzarrie M Doss, NP  traZODone (DESYREL) 50 MG tablet Take 1 tablet (50 mg total) by mouth at bedtime. 01/02/15  Yes Carollee Leitzarrie M Doss, NP  azithromycin (ZITHROMAX) 250 MG tablet Take 2 tablets by mouth on day 1, take 1 tablet by mouth each day after for 4  days. Patient not taking: Reported on 02/20/2015 02/14/15   Carollee Leitzarrie M Doss, NP   VITAL SIGNS:  Blood pressure 129/77, pulse 80, temperature 98.4 F (36.9 C), temperature source Oral, resp. rate 18, height 5\' 5"  (1.651 m), weight 98.431 kg (217 lb), SpO2 97 %. PHYSICAL EXAMINATION:  Physical Exam  Constitutional: She is oriented to person, place, and time and well-developed, well-nourished, and in no distress.  HENT:  Head: Normocephalic and atraumatic.  Eyes: Conjunctivae and EOM are normal. Pupils are equal, round, and reactive to light.  Neck: Normal range of motion. Neck supple. No tracheal deviation present. No thyromegaly present.  Cardiovascular: Normal rate, regular rhythm and normal heart sounds.   Pulmonary/Chest: Effort normal and  breath sounds normal. No respiratory distress. She has no wheezes. She exhibits no tenderness.  Abdominal: Soft. Bowel sounds are normal. She exhibits no distension. There is no tenderness.  Musculoskeletal:       Left hip: She exhibits decreased range of motion, tenderness, bony tenderness and deformity.  External rotation and shortening of the left lower extremity  Neurological: She is alert and oriented to person, place, and time. No cranial nerve deficit.  Skin: Skin is warm and dry. Rash noted. There is erythema.     Right lower shin medially erythema extending from just below knee to the ankle  Psychiatric: Mood and affect normal.   LABORATORY PANEL:   CBC  Recent Labs Lab 02/20/15 1502  WBC 8.9  HGB 13.3  HCT 39.7  PLT 254   ------------------------------------------------------------------------------------------------------------------  Chemistries   Recent Labs Lab 02/20/15 1502  NA 141  K 4.1  CL 97*  CO2 33*  GLUCOSE 147*  BUN 22*  CREATININE 1.28*  CALCIUM 8.9  MG 1.9  AST 23  ALT 14  ALKPHOS 197*  BILITOT 0.6   RADIOLOGY:  Dg Chest Port 1 View  02/20/2015  CLINICAL DATA:  Tripped over area rug and fell at  home. Left femur fracture. Preop. EXAM: PORTABLE CHEST 1 VIEW COMPARISON:  None. FINDINGS: The cardiac silhouette is upper limits of normal to mildly enlarged in size. Thoracic aortic calcification is noted. No airspace consolidation, edema, pleural effusion, or pneumothorax is identified. There is minimal left basilar scarring. Moderate thoracic dextroscoliosis is present. IMPRESSION: 1. No evidence of acute airspace disease. 2. Borderline cardiomegaly. 3. Moderate thoracic dextroscoliosis. Electronically Signed   By: Sebastian Ache M.D.   On: 02/20/2015 15:43   Dg Hip Unilat With Pelvis 2-3 Views Left  02/20/2015  CLINICAL DATA:  Fall with left hip pain. EXAM: DG HIP (WITH OR WITHOUT PELVIS) 2-3V LEFT COMPARISON:  Pelvis 01/16/2015 FINDINGS: There is a displaced and mildly angulated left femoral intertrochanteric fracture. Limited evaluation of the left femoral neck. Left femoral head appears to be located. Stable postsurgical changes in the proximal right femur with an intramedullary rod and hip screw. Again noted is an aortic stent graft with vascular coils in the left internal iliac artery region. Pelvic bony ring appears to be intact. IMPRESSION: Left femoral intertrochanteric fracture. Electronically Signed   By: Richarda Overlie M.D.   On: 02/20/2015 15:45   IMPRESSION AND PLAN:  81 year old female with a known history of arthritis, depression is being admitted for left hip fracture status post mechanical fall twice today  * Preop Medical clearance: Patient is medically clear for planned surgery.  She will be at moderate risk considering her advanced age, and multiple comorbidities.  She does not have any acute EKG changes, chest x-ray is also negative for any acute pathology.  Denies having any acute chest pain or any cardiac issues.  He had several orthopedic surgeries including bilateral total knee replacement and right hip surgery so She knows the drill very well now  * Right leg cellulitis: Switch  her Zithromax to IV clindamycin as she is allergic to penicillin.  * Depression: Continue BuSpar and Lexapro  * Acute renal failure: continue IV fluid hydration and monitor renal function.  Avoid any nephrotoxic medications  All the records are reviewed and case discussed with ED provider. Management plans discussed with the patient, family and they are in agreement.  CODE STATUS: DO NOT RESUSCITATE  TOTAL TIME TAKING CARE OF THIS PATIENT: 55 minutes.  [facility], Arzu Mcgaughey M.D on 02/20/2015 at 5:17 PM  Between 7am to 6pm - Pager - [phone]  After 6pm go to [email or web address] - password [email or web address] [facility]  [place] [facility]  Office  [phone]  CC: Primary care physician; Carollee Leitz, NP

## 2015-02-20 NOTE — ED Notes (Signed)
Patient returned from radiology

## 2015-02-20 NOTE — Telephone Encounter (Signed)
Per the daughter the legs are not as red this morning, and the patient says they feel better this morning.  Not as swollen as yesterday.  Per the patient they felt like a tourniquet was on them yesterday.  She has two other doctor's appointments today  (pain, and another follow up) Advised to go to the ER if continues, she wanted me to let you know.  Please advise?

## 2015-02-20 NOTE — Telephone Encounter (Signed)
Pt needs to go to the ER for evaluation 

## 2015-02-20 NOTE — Consult Note (Addendum)
ORTHOPAEDIC CONSULTATION  REQUESTING PHYSICIAN: Dr. Nicki Reaper  Chief Complaint: Left intertrochanteric hip fracture  HPI: Jennifer Olson is a 81 y.o. female who complains of  pain in left hip after falling at home. Patient states she has poor balance. Patient was using her walker and caught it on the edge of the rug causing her to fall onto her left side.  Patient has had a previous right hip fracture treated with an intramedullary rod. Patient has recently relocated to the [place] area from [place]. She currently has pain in the left hip. She denies any numbness or tingling in the left lower extremity. She denies any other areas of injury after the fall.  Past Medical History  Diagnosis Date  . Arthritis   . Depression   . History of fainting spells of unknown cause   . Migraines   . Blood transfusion without reported diagnosis   . UTI (urinary tract infection)   . Urinary incontinence   . Aneurysm of abdominal aorta (HCC)   . Aneurysm artery, iliac [facility])    Past Surgical History  Procedure Laterality Date  . Breast surgery      Biopsy  . Appendectomy  1980  . Tonsillectomy and adenoidectomy  1934  . Cesarean section      1956/1962  . Foot surgery  1976    Both Feet  . Knee surgery      Both Knees/1999/2000  . Broken hip  2015    Right Hip   Social History   Social History  . Marital Status: Widowed    Spouse Name: N/A  . Number of Children: N/A  . Years of Education: N/A   Social History Main Topics  . Smoking status: Never Smoker   . Smokeless tobacco: None  . Alcohol Use: No  . Drug Use: No  . Sexual Activity: No   Other Topics Concern  . None   Social History Narrative   Patient has recently moved with family from [place] to [place]. Patient is currently living with daughter, son-in-law, and granddaughter.   Patient is retired and widowed.   Patient reports drinking 1 cup of coffee in the morning      Family History  Problem Relation Age  of Onset  . Sudden death Brother    Allergies  Allergen Reactions  . Penicillins Rash   Prior to Admission medications   Medication Sig Start Date End Date Taking? Authorizing Provider  busPIRone (BUSPAR) 5 MG tablet Take 1 tablet (5 mg total) by mouth 2 (two) times daily. 01/02/15  Yes Carollee Leitz, NP  ergocalciferol (VITAMIN D2) 50000 UNITS capsule Take 1 capsule (50,000 Units total) by mouth once a week. Patient taking differently: Take 50,000 Units by mouth once a week. On Monday 01/02/15  Yes Carollee Leitz, NP  escitalopram (LEXAPRO) 10 MG tablet Take 1 tablet (10 mg total) by mouth daily. 01/02/15  Yes Carollee Leitz, NP  fluticasone (FLONASE) 50 MCG/ACT nasal spray Place 2 sprays into both nostrils daily as needed for allergies or rhinitis.    Yes Historical Provider, MD  furosemide (LASIX) 40 MG tablet Take 1 tablet (40 mg total) by mouth daily. 01/02/15  Yes Carollee Leitz, NP  meloxicam (MOBIC) 15 MG tablet Take 1 tablet (15 mg total) by mouth daily. 01/02/15  Yes Carollee Leitz, NP  oxyCODONE (OXY IR/ROXICODONE) 5 MG immediate release tablet Take 1 tablet (5 mg total) by mouth every 6 (six) hours as needed for  severe pain. Patient taking differently: Take 10 mg by mouth 2 (two) times daily.  02/14/15  Yes Carollee Leitzarrie M Doss, NP  pantoprazole (PROTONIX) 20 MG tablet Take 1 tablet (20 mg total) by mouth daily. 01/02/15  Yes Carollee Leitzarrie M Doss, NP  Potassium Chloride ER 20 MEQ TBCR Take 40 mEq by mouth daily with breakfast. 01/02/15  Yes Carollee Leitzarrie M Doss, NP  predniSONE (DELTASONE) 5 MG tablet Take 1 tablet (5 mg total) by mouth daily with breakfast. 01/02/15  Yes Carollee Leitzarrie M Doss, NP  rOPINIRole (REQUIP) 2 MG tablet Take 1 tablet (2 mg total) by mouth 3 (three) times daily. 01/02/15  Yes Carollee Leitzarrie M Doss, NP  simvastatin (ZOCOR) 20 MG tablet Take 1 tablet (20 mg total) by mouth at bedtime. 01/02/15  Yes Carollee Leitzarrie M Doss, NP  solifenacin (VESICARE) 10 MG tablet Take 1 tablet (10 mg total) by mouth daily. 01/02/15  Yes Carollee Leitzarrie M  Doss, NP  tamsulosin (FLOMAX) 0.4 MG CAPS capsule Take 1 capsule (0.4 mg total) by mouth at bedtime. 01/02/15  Yes Carollee Leitzarrie M Doss, NP  traZODone (DESYREL) 50 MG tablet Take 1 tablet (50 mg total) by mouth at bedtime. 01/02/15  Yes Carollee Leitzarrie M Doss, NP  azithromycin (ZITHROMAX) 250 MG tablet Take 2 tablets by mouth on day 1, take 1 tablet by mouth each day after for 4 days. Patient not taking: Reported on 02/20/2015 02/14/15   Carollee Leitzarrie M Doss, NP  bacitracin ointment Apply to affected area two times per day for 10 days. Patient not taking: Reported on 02/20/2015 01/16/15 01/16/16  Myrna Blazeravid Matthew Schaevitz, MD   Dg Chest Port 1 View  02/20/2015  CLINICAL DATA:  Tripped over area rug and fell at home. Left femur fracture. Preop. EXAM: PORTABLE CHEST 1 VIEW COMPARISON:  None. FINDINGS: The cardiac silhouette is upper limits of normal to mildly enlarged in size. Thoracic aortic calcification is noted. No airspace consolidation, edema, pleural effusion, or pneumothorax is identified. There is minimal left basilar scarring. Moderate thoracic dextroscoliosis is present. IMPRESSION: 1. No evidence of acute airspace disease. 2. Borderline cardiomegaly. 3. Moderate thoracic dextroscoliosis. Electronically Signed   By: Sebastian AcheAllen  Grady M.D.   On: 02/20/2015 15:43   Dg Hip Unilat With Pelvis 2-3 Views Left  02/20/2015  CLINICAL DATA:  Fall with left hip pain. EXAM: DG HIP (WITH OR WITHOUT PELVIS) 2-3V LEFT COMPARISON:  Pelvis 01/16/2015 FINDINGS: There is a displaced and mildly angulated left femoral intertrochanteric fracture. Limited evaluation of the left femoral neck. Left femoral head appears to be located. Stable postsurgical changes in the proximal right femur with an intramedullary rod and hip screw. Again noted is an aortic stent graft with vascular coils in the left internal iliac artery region. Pelvic bony ring appears to be intact. IMPRESSION: Left femoral intertrochanteric fracture. Electronically Signed   By: Richarda OverlieAdam   Henn M.D.   On: 02/20/2015 15:45    Positive ROS: All other systems have been reviewed and were otherwise negative with the exception of those mentioned in the HPI and as above.  Physical Exam: General: Alert, no acute distress  MUSCULOSKELETAL: Left shoulder extremity: Skin over the left hip is intact. There is no erythema or ecchymosis or swelling. The thigh and leg compartments are soft and compressible. She has Buck's traction in place. She has palpable pedal pulses and intact sensation light touch. She can flex and extend her toes. The right lower extremity is erythema and edema consistent with cellulitis. She has palpable pedal pulses and can flex  and extend her toes and dorsiflex plantarflex her ankle on the right side.  Assessment: Left intertrochanteric hip fracture, displaced  Plan: I have recommended to the patient that we treat her fracture with an intramedullary nail. I explained to the patient that this is the same surgery she had for her right hip fracture. Having been through the surgery before she is aware of the risks. I reviewed the risks again with her including infection, bleeding, nerve or blood vessel injury, persistent left hip pain, malunion, nonunion, change in lower extremity rotation, leg length discrepancy, hardware failure and the need for further surgery. Medical risks include but are not limited to DVT and pulmonary embolism, myocardial infarction, stroke, pneumonia, respiratory failure and death. Patient understood these risks and wished to proceed.  I spoke with the patient's daughter and son-in-law by phone this evening. I just missed them before they left the hospital.  They understand the patient sustained a left hip fracture and I'm recommending intramedullary fixation. They understand this is the same operation she had on her right side. They were in agreement with the plan for surgery. I will speak with the family again in the morning prior to surgery.  The  patient will continue IV antibiotics for her right leg cellulitis as prescribed by Dr. Sherryll Burger.  I will reevaluate the cellulitis in the morning. Patient is scheduled for surgery tomorrow. She will not receive anticoagulation overnight in preparation for surgery. She will remain nothing by mouth after midnight. I have reviewed all the patients labs as well as the radiologic studies in preparation for this surgery.      Juanell Fairly, MD    02/20/2015 9:37 PM

## 2015-02-21 ENCOUNTER — Inpatient Hospital Stay: Payer: Medicare Other

## 2015-02-21 ENCOUNTER — Encounter
Admission: EM | Disposition: A | Discharge status: Skilled Nursing Facility | Payer: Self-pay | Source: Home / Self Care | Attending: Internal Medicine

## 2015-02-21 ENCOUNTER — Inpatient Hospital Stay: Payer: Medicare Other | Admitting: Anesthesiology

## 2015-02-21 ENCOUNTER — Ambulatory Visit: Payer: Medicare Other | Admitting: Cardiovascular Disease

## 2015-02-21 ENCOUNTER — Encounter: Payer: Self-pay | Admitting: Anesthesiology

## 2015-02-21 ENCOUNTER — Encounter: Payer: Self-pay | Admitting: Nurse Practitioner

## 2015-02-21 DIAGNOSIS — I872 Venous insufficiency (chronic) (peripheral): Secondary | ICD-10-CM | POA: Insufficient documentation

## 2015-02-21 DIAGNOSIS — Z01818 Encounter for other preprocedural examination: Secondary | ICD-10-CM

## 2015-02-21 DIAGNOSIS — I10 Essential (primary) hypertension: Secondary | ICD-10-CM

## 2015-02-21 HISTORY — PX: FEMUR IM NAIL: SHX1597

## 2015-02-21 LAB — CBC WITH DIFFERENTIAL/PLATELET
BASOS ABS: 0.1 10*3/uL (ref 0–0.1)
Basophils Relative: 0 %
Eosinophils Absolute: 0 10*3/uL (ref 0–0.7)
Eosinophils Relative: 0 %
HEMATOCRIT: 33.1 % — AB (ref 35.0–47.0)
HEMOGLOBIN: 11 g/dL — AB (ref 12.0–16.0)
LYMPHS PCT: 4 %
Lymphs Abs: 0.7 10*3/uL — ABNORMAL LOW (ref 1.0–3.6)
MCH: 32.5 pg (ref 26.0–34.0)
MCHC: 33.2 g/dL (ref 32.0–36.0)
MCV: 97.9 fL (ref 80.0–100.0)
Monocytes Absolute: 0.5 10*3/uL (ref 0.2–0.9)
Monocytes Relative: 3 %
NEUTROS ABS: 15.7 10*3/uL — AB (ref 1.4–6.5)
Neutrophils Relative %: 93 %
Platelets: 195 10*3/uL (ref 150–440)
RBC: 3.38 MIL/uL — AB (ref 3.80–5.20)
RDW: 13.5 % (ref 11.5–14.5)
WBC: 17 10*3/uL — AB (ref 3.6–11.0)

## 2015-02-21 LAB — CBC
HEMATOCRIT: 33.1 % — AB (ref 35.0–47.0)
Hemoglobin: 11 g/dL — ABNORMAL LOW (ref 12.0–16.0)
MCH: 32.1 pg (ref 26.0–34.0)
MCHC: 33.2 g/dL (ref 32.0–36.0)
MCV: 96.8 fL (ref 80.0–100.0)
Platelets: 187 10*3/uL (ref 150–440)
RBC: 3.42 MIL/uL — ABNORMAL LOW (ref 3.80–5.20)
RDW: 13.7 % (ref 11.5–14.5)
WBC: 7.9 10*3/uL (ref 3.6–11.0)

## 2015-02-21 LAB — GLUCOSE, CAPILLARY
Glucose-Capillary: 87 mg/dL (ref 65–99)
Glucose-Capillary: 131 mg/dL — ABNORMAL HIGH (ref 65–99)
Glucose-Capillary: 148 mg/dL — ABNORMAL HIGH (ref 65–99)

## 2015-02-21 LAB — BASIC METABOLIC PANEL
ANION GAP: 6 (ref 5–15)
BUN: 19 mg/dL (ref 6–20)
CALCIUM: 8.2 mg/dL — AB (ref 8.9–10.3)
CHLORIDE: 102 mmol/L (ref 101–111)
CO2: 33 mmol/L — AB (ref 22–32)
Creatinine, Ser: 1.1 mg/dL — ABNORMAL HIGH (ref 0.44–1.00)
GFR calc Af Amer: 51 mL/min — ABNORMAL LOW (ref >60)
GFR calc non Af Amer: 44 mL/min — ABNORMAL LOW (ref >60)
GLUCOSE: 83 mg/dL (ref 65–99)
POTASSIUM: 3.5 mmol/L (ref 3.5–5.1)
Sodium: 141 mmol/L (ref 135–145)

## 2015-02-21 LAB — VITAMIN D 25 HYDROXY (VIT D DEFICIENCY, FRACTURES): Vit D, 25-Hydroxy: 40.7 ng/mL (ref 30.0–100.0)

## 2015-02-21 LAB — HEMOGLOBIN: Hemoglobin: 9.2 g/dL — ABNORMAL LOW (ref 12.0–16.0)

## 2015-02-21 SURGERY — INSERTION, INTRAMEDULLARY ROD, FEMUR
Anesthesia: General | Site: Hip | Laterality: Left | Wound class: Clean

## 2015-02-21 MED ORDER — FENTANYL CITRATE (PF) 100 MCG/2ML IJ SOLN
25.0000 ug | INTRAMUSCULAR | Status: DC | PRN
  Administered 2015-02-21 (×2): 25 ug via INTRAVENOUS

## 2015-02-21 MED ORDER — POLYETHYLENE GLYCOL 3350 17 G PO PACK: 17.0000 g | PACK | Freq: Every day | ORAL | Status: DC | PRN

## 2015-02-21 MED ORDER — SUCCINYLCHOLINE CHLORIDE 20 MG/ML IJ SOLN
INTRAMUSCULAR | Status: DC | PRN
  Administered 2015-02-21: 100 mg via INTRAVENOUS

## 2015-02-21 MED ORDER — MENTHOL 3 MG MT LOZG
1.0000 | LOZENGE | OROMUCOSAL | Status: DC | PRN
  Filled 2015-02-21: qty 9

## 2015-02-21 MED ORDER — OXYCODONE HCL 5 MG PO TABS
5.0000 mg | ORAL_TABLET | ORAL | Status: DC | PRN
  Administered 2015-02-22 (×2): 5 mg via ORAL
  Administered 2015-02-23: 10 mg via ORAL
  Filled 2015-02-21 (×2): qty 1
  Filled 2015-02-21: qty 2

## 2015-02-21 MED ORDER — ALUM & MAG HYDROXIDE-SIMETH 200-200-20 MG/5ML PO SUSP: 30.0000 mL | ORAL | Status: DC | PRN

## 2015-02-21 MED ORDER — SODIUM CHLORIDE 0.9 % IV SOLN: 75.0000 mL/h | INTRAVENOUS | Status: DC

## 2015-02-21 MED ORDER — DEXAMETHASONE SODIUM PHOSPHATE 4 MG/ML IJ SOLN
INTRAMUSCULAR | Status: DC | PRN
  Administered 2015-02-21: 5 mg via INTRAVENOUS

## 2015-02-21 MED ORDER — ONDANSETRON HCL 4 MG/2ML IJ SOLN
4.0000 mg | Freq: Four times a day (QID) | INTRAMUSCULAR | Status: DC | PRN
  Administered 2015-02-22: 4 mg via INTRAVENOUS
  Filled 2015-02-21: qty 2

## 2015-02-21 MED ORDER — DOCUSATE SODIUM 100 MG PO CAPS
100.0000 mg | ORAL_CAPSULE | Freq: Two times a day (BID) | ORAL | Status: DC
Start: 2015-02-21 — End: 2015-02-24
  Administered 2015-02-21 – 2015-02-24 (×6): 100 mg via ORAL
  Filled 2015-02-21 (×6): qty 1

## 2015-02-21 MED ORDER — PHENOL 1.4 % MT LIQD
1.0000 | OROMUCOSAL | Status: DC | PRN
  Filled 2015-02-21: qty 177

## 2015-02-21 MED ORDER — SODIUM CHLORIDE 0.9 % IV BOLUS (SEPSIS)
250.0000 mL | Freq: Once | INTRAVENOUS | Status: AC
  Administered 2015-02-21: 250 mL via INTRAVENOUS

## 2015-02-21 MED ORDER — BISACODYL 10 MG RE SUPP: 10.0000 mg | Freq: Every day | RECTAL | Status: DC | PRN

## 2015-02-21 MED ORDER — ONDANSETRON HCL 4 MG PO TABS: 4.0000 mg | ORAL_TABLET | Freq: Four times a day (QID) | ORAL | Status: DC | PRN

## 2015-02-21 MED ORDER — MORPHINE SULFATE (PF) 2 MG/ML IV SOLN: 2.0000 mg | INTRAVENOUS | Status: DC | PRN

## 2015-02-21 MED ORDER — NEOMYCIN-POLYMYXIN B GU 40-200000 IR SOLN
Status: DC | PRN
  Administered 2015-02-21: 2 mL

## 2015-02-21 MED ORDER — SODIUM CHLORIDE 0.9 % IV BOLUS (SEPSIS): 1000.0000 mL | Freq: Once | INTRAVENOUS | Status: DC

## 2015-02-21 MED ORDER — EPHEDRINE SULFATE 50 MG/ML IJ SOLN
INTRAMUSCULAR | Status: DC | PRN
  Administered 2015-02-21 (×2): 5 mg via INTRAVENOUS
  Administered 2015-02-21: 10 mg via INTRAVENOUS

## 2015-02-21 MED ORDER — ROCURONIUM BROMIDE 100 MG/10ML IV SOLN
INTRAVENOUS | Status: DC | PRN
  Administered 2015-02-21: 10 mg via INTRAVENOUS

## 2015-02-21 MED ORDER — PROPOFOL 10 MG/ML IV BOLUS
INTRAVENOUS | Status: DC | PRN
  Administered 2015-02-21: 10 mg via INTRAVENOUS
  Administered 2015-02-21: 80 mg via INTRAVENOUS

## 2015-02-21 MED ORDER — CLINDAMYCIN PHOSPHATE 600 MG/50ML IV SOLN
600.0000 mg | Freq: Four times a day (QID) | INTRAVENOUS | Status: AC
  Administered 2015-02-21 (×2): 600 mg via INTRAVENOUS
  Filled 2015-02-21 (×2): qty 50

## 2015-02-21 MED ORDER — ACETAMINOPHEN 650 MG RE SUPP: 650.0000 mg | Freq: Four times a day (QID) | RECTAL | Status: DC | PRN

## 2015-02-21 MED ORDER — SODIUM CHLORIDE 0.9 % IV SOLN
INTRAVENOUS | Status: DC
  Administered 2015-02-21 – 2015-02-23 (×2): via INTRAVENOUS

## 2015-02-21 MED ORDER — LIDOCAINE HCL (CARDIAC) 20 MG/ML IV SOLN
INTRAVENOUS | Status: DC | PRN
  Administered 2015-02-21: 60 mg via INTRAVENOUS

## 2015-02-21 MED ORDER — ENOXAPARIN SODIUM 40 MG/0.4ML ~~LOC~~ SOLN
40.0000 mg | SUBCUTANEOUS | Status: DC
  Administered 2015-02-22 – 2015-02-24 (×3): 40 mg via SUBCUTANEOUS
  Filled 2015-02-21 (×3): qty 0.4

## 2015-02-21 MED ORDER — FENTANYL CITRATE (PF) 100 MCG/2ML IJ SOLN
INTRAMUSCULAR | Status: DC | PRN
  Administered 2015-02-21 (×2): 50 ug via INTRAVENOUS

## 2015-02-21 MED ORDER — SENNA 8.6 MG PO TABS
1.0000 | ORAL_TABLET | Freq: Two times a day (BID) | ORAL | Status: DC
  Administered 2015-02-22 – 2015-02-24 (×5): 8.6 mg via ORAL
  Filled 2015-02-21 (×6): qty 1

## 2015-02-21 MED ORDER — ACETAMINOPHEN 325 MG PO TABS
650.0000 mg | ORAL_TABLET | Freq: Four times a day (QID) | ORAL | Status: DC | PRN
  Administered 2015-02-22 – 2015-02-24 (×4): 650 mg via ORAL
  Filled 2015-02-21 (×4): qty 2

## 2015-02-21 MED ORDER — RISAQUAD PO CAPS
1.0000 | ORAL_CAPSULE | Freq: Every day | ORAL | Status: DC
  Administered 2015-02-22 – 2015-02-24 (×3): 1 via ORAL
  Filled 2015-02-21 (×3): qty 1

## 2015-02-21 MED ORDER — OXYCODONE HCL 5 MG/5ML PO SOLN
5.0000 mg | Freq: Once | ORAL | Status: AC | PRN
Start: 2015-02-21 — End: 2015-02-21

## 2015-02-21 MED ORDER — OXYCODONE HCL 5 MG PO TABS
5.0000 mg | ORAL_TABLET | Freq: Once | ORAL | Status: AC | PRN
  Administered 2015-02-21: 5 mg via ORAL

## 2015-02-21 MED ORDER — OXYCODONE HCL 5 MG PO TABS: 5.0000 mg | ORAL_TABLET | ORAL | Status: DC | PRN

## 2015-02-21 MED ORDER — FERROUS SULFATE 325 (65 FE) MG PO TABS
325.0000 mg | ORAL_TABLET | Freq: Three times a day (TID) | ORAL | Status: DC
  Administered 2015-02-22 – 2015-02-24 (×5): 325 mg via ORAL
  Filled 2015-02-21 (×6): qty 1

## 2015-02-21 MED ORDER — PHENYLEPHRINE HCL 10 MG/ML IJ SOLN
INTRAMUSCULAR | Status: DC | PRN
  Administered 2015-02-21 (×3): 200 ug via INTRAVENOUS
  Administered 2015-02-21: 100 ug via INTRAVENOUS
  Administered 2015-02-21: 200 ug via INTRAVENOUS
  Administered 2015-02-21 (×2): 100 ug via INTRAVENOUS

## 2015-02-21 MED ORDER — MAGNESIUM CITRATE PO SOLN
1.0000 | Freq: Once | ORAL | Status: DC | PRN
  Filled 2015-02-21: qty 296

## 2015-02-21 MED ORDER — ONDANSETRON HCL 4 MG/2ML IJ SOLN
INTRAMUSCULAR | Status: DC | PRN
  Administered 2015-02-21: 4 mg via INTRAVENOUS

## 2015-02-21 SURGICAL SUPPLY — 36 items
BIT DRILL 4.3MMS DISTAL GRDTED (BIT) ×1 IMPLANT
BNDG COHESIVE 6X5 TAN STRL LF (GAUZE/BANDAGES/DRESSINGS) ×6 IMPLANT
CANISTER SUCT 1200ML W/VALVE (MISCELLANEOUS) ×3 IMPLANT
CORTICAL BONE SCR 5.0MM X 46MM (Screw) ×3 IMPLANT
DRAPE SHEET LG 3/4 BI-LAMINATE (DRAPES) ×6 IMPLANT
DRAPE SURG 17X11 SM STRL (DRAPES) ×6 IMPLANT
DRAPE U-SHAPE 47X51 STRL (DRAPES) ×3 IMPLANT
DRILL 4.3MMS DISTAL GRADUATED (BIT) ×3
DRSG OPSITE POSTOP 4X14 (GAUZE/BANDAGES/DRESSINGS) ×3 IMPLANT
DURAPREP 26ML APPLICATOR (WOUND CARE) ×6 IMPLANT
GLOVE BIOGEL PI IND STRL 9 (GLOVE) ×1 IMPLANT
GLOVE BIOGEL PI INDICATOR 9 (GLOVE) ×2
GLOVE SURG 9.0 ORTHO LTXF (GLOVE) ×6 IMPLANT
GOWN STRL REUS TWL 2XL XL LVL4 (GOWN DISPOSABLE) ×3 IMPLANT
GOWN STRL REUS W/ TWL LRG LVL3 (GOWN DISPOSABLE) ×1 IMPLANT
GOWN STRL REUS W/TWL LRG LVL3 (GOWN DISPOSABLE) ×2
GUIDEPIN VERSANAIL DSP 3.2X444 ×3 IMPLANT
GUIDEWIRE BALL NOSE 100CM (WIRE) ×3 IMPLANT
HEMOVAC 400CC 10FR (MISCELLANEOUS) ×3 IMPLANT
KIT RM TURNOVER CYSTO AR (KITS) ×3 IMPLANT
MAT BLUE FLOOR 46X72 FLO (MISCELLANEOUS) ×3 IMPLANT
NAIL HIP FRACT LT 130D 11X400 (Nail) ×3 IMPLANT
NS IRRIG 1000ML POUR BTL (IV SOLUTION) ×3 IMPLANT
PACK HIP COMPR (MISCELLANEOUS) ×3 IMPLANT
PAD GROUND ADULT SPLIT (MISCELLANEOUS) ×3 IMPLANT
SCREW BONE CORTICAL 5.0X50 (Screw) ×3 IMPLANT
SCREW CORTICL BON 5.0MM X 46MM (Screw) ×1 IMPLANT
SCREW LAG 10.5MMX105MM HFN (Screw) ×3 IMPLANT
SCREWDRIVER HEX TIP 3.5MM (MISCELLANEOUS) ×3 IMPLANT
STAPLER SKIN PROX 35W (STAPLE) ×3 IMPLANT
SUCTION FRAZIER TIP 10 FR DISP (SUCTIONS) ×3 IMPLANT
SUT VIC AB 0 CT1 36 (SUTURE) ×6 IMPLANT
SUT VIC AB 2-0 CT1 27 (SUTURE) ×2
SUT VIC AB 2-0 CT1 TAPERPNT 27 (SUTURE) ×1 IMPLANT
SUT VICRYL 0 AB UR-6 (SUTURE) ×3 IMPLANT
SYR 30ML LL (SYRINGE) ×3 IMPLANT

## 2015-02-21 NOTE — Progress Notes (Signed)
[facility] - [place] at [facility]   PATIENT NAME: Jennifer AlkenGene Olson    MR#:  [id number]  DATE OF BIRTH:  01/30/1926  SUBJECTIVE:  CHIEF COMPLAINT:   Chief Complaint  Patient presents with  . Fall  . Hip Pain   - Patient admitted after a fall and left hip fracture. For surgery today. -Also has right leg cellulitis. Alert and appropriate. Denies any complaints at this time other than left hip pain  REVIEW OF SYSTEMS:  Review of Systems  Constitutional: Negative for fever and chills.  HENT: Negative for ear discharge, ear pain and nosebleeds.   Eyes: Negative for blurred vision and double vision.  Respiratory: Negative for cough, shortness of breath and wheezing.   Cardiovascular: Positive for leg swelling. Negative for chest pain and palpitations.  Gastrointestinal: Negative for nausea, vomiting, abdominal pain, diarrhea and constipation.  Genitourinary: Negative for dysuria.  Musculoskeletal: Positive for myalgias and joint pain.  Skin: Positive for rash.  Neurological: Negative for dizziness, sensory change, speech change, focal weakness, seizures and headaches.  Psychiatric/Behavioral: Negative for depression.    DRUG ALLERGIES:   Allergies  Allergen Reactions  . Penicillins Rash    VITALS:  Blood pressure 92/48, pulse 73, temperature 98.2 F (36.8 C), temperature source Oral, resp. rate 14, height 5\' 5"  (1.651 m), weight 98.431 kg (217 lb), SpO2 97 %.  PHYSICAL EXAMINATION:  Physical Exam  GENERAL:  81 y.o.-year-old obese patient lying in the bed with no acute distress.  EYES: Pupils equal, round, reactive to light and accommodation. No scleral icterus. Extraocular muscles intact.  HEENT: Head atraumatic, normocephalic. Oropharynx and nasopharynx clear.  NECK:  Supple, no jugular venous distention. No thyroid enlargement, no tenderness.  LUNGS: Normal breath sounds bilaterally, no wheezing, rales,rhonchi or crepitation. No use of accessory  muscles of respiration. Decreased bibasilar breath sounds CARDIOVASCULAR: S1, S2 normal. No rubs, or gallops. 3/6 systolic murmur present ABDOMEN: Soft, nontender, nondistended. Bowel sounds present. No organomegaly or mass.  EXTREMITIES: Left leg is abducted and externally rotated, 1+ edema present, right lower leg with 2+ edema, erythema with increased shiny skin from stretching noted. No  cyanosis, or clubbing.  NEUROLOGIC: Cranial nerves II through XII are intact. Muscle strength 5/5 in all extremities except lower extremities due to pain. Sensation intact. Gait not checked.  PSYCHIATRIC: The patient is alert and oriented x 3.  SKIN: No obvious rash, lesion, or ulcer other than the erythema from right leg cellulitis.    LABORATORY PANEL:   CBC  Recent Labs Lab 02/21/15 0609  WBC 7.9  HGB 11.0*  HCT 33.1*  PLT 187   ------------------------------------------------------------------------------------------------------------------  Chemistries   Recent Labs Lab 02/20/15 1502 02/21/15 0609  NA 141 141  K 4.1 3.5  CL 97* 102  CO2 33* 33*  GLUCOSE 147* 83  BUN 22* 19  CREATININE 1.28* 1.10*  CALCIUM 8.9 8.2*  MG 1.9  --   AST 23  --   ALT 14  --   ALKPHOS 197*  --   BILITOT 0.6  --    ------------------------------------------------------------------------------------------------------------------  Cardiac Enzymes  Recent Labs Lab 02/20/15 1502  TROPONINI 0.03   ------------------------------------------------------------------------------------------------------------------  RADIOLOGY:  Dg Chest Port 1 View  02/20/2015  CLINICAL DATA:  Tripped over area rug and fell at home. Left femur fracture. Preop. EXAM: PORTABLE CHEST 1 VIEW COMPARISON:  None. FINDINGS: The cardiac silhouette is upper limits of normal to mildly enlarged in size. Thoracic aortic calcification is noted. No airspace consolidation,  edema, pleural effusion, or pneumothorax is identified. There  is minimal left basilar scarring. Moderate thoracic dextroscoliosis is present. IMPRESSION: 1. No evidence of acute airspace disease. 2. Borderline cardiomegaly. 3. Moderate thoracic dextroscoliosis. Electronically Signed   By: Sebastian Ache M.D.   On: 02/20/2015 15:43   Dg Hip Unilat With Pelvis 2-3 Views Left  02/20/2015  CLINICAL DATA:  Fall with left hip pain. EXAM: DG HIP (WITH OR WITHOUT PELVIS) 2-3V LEFT COMPARISON:  Pelvis 01/16/2015 FINDINGS: There is a displaced and mildly angulated left femoral intertrochanteric fracture. Limited evaluation of the left femoral neck. Left femoral head appears to be located. Stable postsurgical changes in the proximal right femur with an intramedullary rod and hip screw. Again noted is an aortic stent graft with vascular coils in the left internal iliac artery region. Pelvic bony ring appears to be intact. IMPRESSION: Left femoral intertrochanteric fracture. Electronically Signed   By: Richarda Overlie M.D.   On: 02/20/2015 15:45    EKG:   Orders placed or performed during the hospital encounter of 02/20/15  . ED EKG  . ED EKG  . EKG 12-Lead  . EKG 12-Lead    ASSESSMENT AND PLAN:   81 year old female with past medical history significant for arthritis, peripheral vascular disease, depression and anxiety presented to the hospital after a fall and noted to have left hip fracture.  #1 left hip fracture-appreciate orthopedic consult -For intraoperative nailing today. She has been cleared by the admitting physician -Pain management -Postoperative DVT prophylaxis and physical therapy recommended  #2 right leg cellulitis-continue clindamycin IV today. Clindamycin can be changed over to oral tomorrow. Added a probiotic while on clindamycin.  #3 depression and anxiety continue Lexapro.  #4 hyperlipidemia-on statin  #5 GERD-on Protonix  #6 DVT prophylaxis-likely will be started after surgery  IV fluids can be discontinued after surgery for urine output is  improved. She is at high risk of pulmonary edema and fluid retention   All the records are reviewed and case discussed with Care Management/Social Workerr. Management plans discussed with the patient, family and they are in agreement.  CODE STATUS: DNR  TOTAL TIME TAKING CARE OF THIS PATIENT: 38 minutes.   POSSIBLE D/C IN 2-3 DAYS, DEPENDING ON CLINICAL CONDITION.   Enid Baas M.D on 02/21/2015 at 11:27 AM  Between 7am to 6pm - Pager - [phone]  After 6pm go to [email or web address] - password [email or web address] [facility] [place]  [facility]  Office  [phone]  CC: Primary care physician; Carollee Leitz, NP

## 2015-02-21 NOTE — Clinical Social Work Note (Signed)
If the nursing home in [place] billed for 8/28 then patient has only been out of a facility for 59 days. Thus, she has missed being out of a facility (wellness period) of 60 days and thus would not have any medicare days for STR. The facility in [place] has not returned my call so there is no way to verify this at this time. I have updated patient's daughter regarding this. Patient is having a prolonged stay in PACU at the time of my call to the daughter as patient's blood pressure was dropping. CSW has recommended focusing on getting patient through surgery safely and then having PT work with patient and go from there with the potential of knowing that patient may have [facility] or consider private pay. An option not discussed as of yet is the possibility of doing an LOG. Patient's daughter has informed CSW that [facility] [place] already.  York SpanielMonica Iver Olson MSW,LCSW [phone]

## 2015-02-21 NOTE — Care Management Note (Addendum)
Case Management Note  Patient Details  Name: Jennifer Olson MRN: [id number] Date of Birth: Jan 12, 1926  Subjective/Objective:                  Met with patient prior to surgery today to discuss discharge planning. She lives with her daughter Abner Greenspan. She typically uses a front-wheeled walker for ambulation. She also has a rollator that she is not able to use. She is on chronic O2 but does not remember the agency that provided it. She states she is open to HHPT but doesn't recall name of agency. Patient agrees to SNF if needed.   Action/Plan: List of home health agencies left at patient's bedside. I left message at [facility] number [phone] to find out name of agencies providing above services. Notified by RN that Abner Greenspan would like for patient to go to SNF at discharge.   Expected Discharge Date:                  Expected Discharge Plan:     In-House Referral:  Clinical Social Work  Discharge planning Services  CM Consult  Post Acute Care Choice:  Durable Medical Equipment, Home Health Choice offered to:  Patient  DME Arranged:    DME Agency:     HH Arranged:    [place] Agency:     Status of Service:  In process, will continue to follow  Medicare Important Message Given:    Date Medicare IM Given:    Medicare IM give by:    Date Additional Medicare IM Given:    Additional Medicare Important Message give by:     If discussed at [place] of Stay Meetings, dates discussed:    Additional Comments: PCP Dr. Derrel Nip [phone] arranged home health per daughter Abner Greenspan. Her home O2 is through [place] and she has portable tanks. Abner Greenspan has requested [employer] She is open to [facility]. I have notified Sharrie Rothman with [facility] of patient's admission.   Marshell Garfinkel, RN 02/21/2015, 10:05 AM

## 2015-02-21 NOTE — Progress Notes (Signed)
Subjective:  Patient reports pain as marked. Spoke with the patient and her family at the bedside this morning in her hospital room. Patient is alert and answers questions and follows commands appropriately.  Objective:   VITALS:   Filed Vitals:   02/21/15 0615 02/21/15 0734 02/21/15 0840 02/21/15 0842  BP: 105/57 97/48 92/54  92/48  Pulse: 73 73    Temp: 98.2 F (36.8 C) 98.2 F (36.8 C)    TempSrc: Oral Oral    Resp: 18 14    Height:      Weight:      SpO2: 97% 97%      PHYSICAL EXAM:  Left lower extremity: Patient remains neurovascularly intact. Buck's tractions on the left lower extremity. The left hip skin remains intact. There is mild swelling but no erythema or ecchymosis.  Right lower extremity: Patient's erythema looks improved. She is less edematous of this morning. She remains neurovascular intact in the right lower extremity.   LABS  Results for orders placed or performed during the hospital encounter of 02/20/15 (from the past 24 hour(s))  Type and screen [facility]     Status: None   Collection Time: 02/20/15  3:02 PM  Result Value Ref Range   ABO/RH(D) O POS    Antibody Screen NEG    Sample Expiration 02/23/2015   Protime-INR     Status: None   Collection Time: 02/20/15  3:02 PM  Result Value Ref Range   Prothrombin Time 13.2 11.4 - 15.0 seconds   INR 0.98   APTT     Status: None   Collection Time: 02/20/15  3:02 PM  Result Value Ref Range   aPTT 27 24 - 36 seconds  Comprehensive metabolic panel     Status: Abnormal   Collection Time: 02/20/15  3:02 PM  Result Value Ref Range   Sodium 141 135 - 145 mmol/L   Potassium 4.1 3.5 - 5.1 mmol/L   Chloride 97 (L) 101 - 111 mmol/L   CO2 33 (H) 22 - 32 mmol/L   Glucose, Bld 147 (H) 65 - 99 mg/dL   BUN 22 (H) 6 - 20 mg/dL   Creatinine, Ser 0.45 (H) 0.44 - 1.00 mg/dL   Calcium 8.9 8.9 - 40.9 mg/dL   Total Protein 6.8 6.5 - 8.1 g/dL   Albumin 3.5 3.5 - 5.0 g/dL   AST 23 15 - 41 U/L    ALT 14 14 - 54 U/L   Alkaline Phosphatase 197 (H) 38 - 126 U/L   Total Bilirubin 0.6 0.3 - 1.2 mg/dL   GFR calc non Af Amer 36 (L) >60 mL/min   GFR calc Af Amer 42 (L) >60 mL/min   Anion gap 11 5 - 15  Lactic acid, plasma     Status: None   Collection Time: 02/20/15  3:02 PM  Result Value Ref Range   Lactic Acid, Venous 1.7 0.5 - 2.0 mmol/L  CBC with Differential/Platelet     Status: Abnormal   Collection Time: 02/20/15  3:02 PM  Result Value Ref Range   WBC 8.9 3.6 - 11.0 K/uL   RBC 4.09 3.80 - 5.20 MIL/uL   Hemoglobin 13.3 12.0 - 16.0 g/dL   HCT 81.1 91.4 - 78.2 %   MCV 97.1 80.0 - 100.0 fL   MCH 32.4 26.0 - 34.0 pg   MCHC 33.4 32.0 - 36.0 g/dL   RDW 95.6 21.3 - 08.6 %   Platelets 254 150 - 440 K/uL  Neutrophils Relative % 85 %   Neutro Abs 7.6 (H) 1.4 - 6.5 K/uL   Lymphocytes Relative 9 %   Lymphs Abs 0.8 (L) 1.0 - 3.6 K/uL   Monocytes Relative 5 %   Monocytes Absolute 0.4 0.2 - 0.9 K/uL   Eosinophils Relative 0 %   Eosinophils Absolute 0.0 0 - 0.7 K/uL   Basophils Relative 1 %   Basophils Absolute 0.1 0 - 0.1 K/uL  Urinalysis complete, with microscopic ([facility] only)     Status: Abnormal   Collection Time: 02/20/15  3:02 PM  Result Value Ref Range   Color, Urine COLORLESS (A) YELLOW   APPearance CLEAR (A) CLEAR   Glucose, UA NEGATIVE NEGATIVE mg/dL   Bilirubin Urine NEGATIVE NEGATIVE   Ketones, ur NEGATIVE NEGATIVE mg/dL   Specific Gravity, Urine 1.005 1.005 - 1.030   Hgb urine dipstick NEGATIVE NEGATIVE   pH 8.0 5.0 - 8.0   Protein, ur NEGATIVE NEGATIVE mg/dL   Nitrite NEGATIVE NEGATIVE   Leukocytes, UA NEGATIVE NEGATIVE   RBC / HPF 0-5 0 - 5 RBC/hpf   WBC, UA 0-5 0 - 5 WBC/hpf   Bacteria, UA NONE SEEN NONE SEEN   Squamous Epithelial / LPF NONE SEEN NONE SEEN  Troponin I     Status: None   Collection Time: 02/20/15  3:02 PM  Result Value Ref Range   Troponin I 0.03 <0.031 ng/mL  Magnesium     Status: None   Collection Time: 02/20/15  3:02 PM  Result Value  Ref Range   Magnesium 1.9 1.7 - 2.4 mg/dL  Urine culture     Status: None (Preliminary result)   Collection Time: 02/20/15  3:02 PM  Result Value Ref Range   Specimen Description URINE, RANDOM    Special Requests NONE    Culture NO GROWTH < 12 HOURS    Report Status PENDING   Vit D  25 hydroxy (rtn osteoporosis monitoring)     Status: None   Collection Time: 02/20/15  3:02 PM  Result Value Ref Range   Vit D, 25-Hydroxy 40.7 30.0 - 100.0 ng/mL  ABO/Rh     Status: None   Collection Time: 02/20/15  3:03 PM  Result Value Ref Range   ABO/RH(D) O POS   MRSA PCR Screening     Status: None   Collection Time: 02/20/15  6:15 PM  Result Value Ref Range   MRSA by PCR NEGATIVE NEGATIVE  Lactic acid, plasma     Status: None   Collection Time: 02/20/15  8:15 PM  Result Value Ref Range   Lactic Acid, Venous 1.8 0.5 - 2.0 mmol/L  CBC     Status: Abnormal   Collection Time: 02/21/15  6:09 AM  Result Value Ref Range   WBC 7.9 3.6 - 11.0 K/uL   RBC 3.42 (L) 3.80 - 5.20 MIL/uL   Hemoglobin 11.0 (L) 12.0 - 16.0 g/dL   HCT 16.1 (L) 09.6 - 04.5 %   MCV 96.8 80.0 - 100.0 fL   MCH 32.1 26.0 - 34.0 pg   MCHC 33.2 32.0 - 36.0 g/dL   RDW 40.9 81.1 - 91.4 %   Platelets 187 150 - 440 K/uL  Basic metabolic panel     Status: Abnormal   Collection Time: 02/21/15  6:09 AM  Result Value Ref Range   Sodium 141 135 - 145 mmol/L   Potassium 3.5 3.5 - 5.1 mmol/L   Chloride 102 101 - 111 mmol/L   CO2 33 (H)  22 - 32 mmol/L   Glucose, Bld 83 65 - 99 mg/dL   BUN 19 6 - 20 mg/dL   Creatinine, Ser 7.821.10 (H) 0.44 - 1.00 mg/dL   Calcium 8.2 (L) 8.9 - 10.3 mg/dL   GFR calc non Af Amer 44 (L) >60 mL/min   GFR calc Af Amer 51 (L) >60 mL/min   Anion gap 6 5 - 15  Glucose, capillary     Status: None   Collection Time: 02/21/15  7:36 AM  Result Value Ref Range   Glucose-Capillary 87 65 - 99 mg/dL   Comment 1 Notify RN     Dg Chest Port 1 View  02/20/2015  CLINICAL DATA:  Tripped over area rug and fell at home.  Left femur fracture. Preop. EXAM: PORTABLE CHEST 1 VIEW COMPARISON:  None. FINDINGS: The cardiac silhouette is upper limits of normal to mildly enlarged in size. Thoracic aortic calcification is noted. No airspace consolidation, edema, pleural effusion, or pneumothorax is identified. There is minimal left basilar scarring. Moderate thoracic dextroscoliosis is present. IMPRESSION: 1. No evidence of acute airspace disease. 2. Borderline cardiomegaly. 3. Moderate thoracic dextroscoliosis. Electronically Signed   By: Sebastian AcheAllen  Grady M.D.   On: 02/20/2015 15:43   Dg Hip Unilat With Pelvis 2-3 Views Left  02/20/2015  CLINICAL DATA:  Fall with left hip pain. EXAM: DG HIP (WITH OR WITHOUT PELVIS) 2-3V LEFT COMPARISON:  Pelvis 01/16/2015 FINDINGS: There is a displaced and mildly angulated left femoral intertrochanteric fracture. Limited evaluation of the left femoral neck. Left femoral head appears to be located. Stable postsurgical changes in the proximal right femur with an intramedullary rod and hip screw. Again noted is an aortic stent graft with vascular coils in the left internal iliac artery region. Pelvic bony ring appears to be intact. IMPRESSION: Left femoral intertrochanteric fracture. Electronically Signed   By: Richarda OverlieAdam  Henn M.D.   On: 02/20/2015 15:45    Assessment/Plan: Day of Surgery   Active Problems:   Hip fracture [facility](HCC)  Patient was examined with her family at the bedside. We all looked together at the right lower leg this morning. The patient's daughter and son-in-law agreed the cellulitis looks improved since her hospital admission. I feel it looks significantly improved even since my exam of the right leg last night. She is responding to the IV clindamycin. We discussed the potential for infection of the surgical site from any active infection in the body. They understand that with cellulitis of right lower leg there is a possibility of spread to the left hip surgical site.  She has been afebrile  and has a normal white count. There is no evidence for systemic infection. Patient is having significant pain in the left hip. I feel the benefit of surgery for the left hip today outweighs the risk of possible infection. The patient and her family agree. They're aware the risks of surgery and are willing to proceed.    Juanell FairlyKRASINSKI, Anthea Udovich , MD 02/21/2015, 10:03 AM

## 2015-02-21 NOTE — Anesthesia Preprocedure Evaluation (Signed)
Anesthesia Evaluation  Patient identified by MRN, date of birth, ID band Patient awake    Reviewed: Allergy & Precautions, H&P , NPO status , Patient's Chart, lab work & pertinent test results  Airway Mallampati: III  TM Distance: >3 FB Neck ROM: limited    Dental no notable dental hx. (+) Poor Dentition, Missing   Pulmonary neg pulmonary ROS, neg shortness of breath, COPD,    Pulmonary exam normal breath sounds clear to auscultation       Cardiovascular Exercise Tolerance: Poor (-) angina+ Peripheral Vascular Disease and +CHF  Normal cardiovascular exam Rhythm:regular Rate:Normal     Neuro/Psych  Headaches, PSYCHIATRIC DISORDERS Depression negative neurological ROS     GI/Hepatic negative GI ROS, Neg liver ROS, GERD  Controlled,  Endo/Other  negative endocrine ROS  Renal/GU negative Renal ROS  negative genitourinary   Musculoskeletal  (+) Arthritis ,   Abdominal   Peds  Hematology negative hematology ROS (+)   Anesthesia Other Findings Past Medical History:   Arthritis                                                    Depression                                                   History of fainting spells of unknown cause                  Migraines                                                    Blood transfusion without reported diagnosis                 UTI (urinary tract infection)                                Urinary incontinence                                         Aneurysm of abdominal aorta (HCC)                            Aneurysm artery, iliac (HCC)                                Past Surgical History:   BREAST SURGERY                                                  Comment:Biopsy   APPENDECTOMY  1980         TONSILLECTOMY AND ADENOIDECTOMY                  1934         CESAREAN SECTION                                                Comment:1956/1962  FOOT SURGERY                                     1976           Comment:Both Feet   KNEE SURGERY                                                    Comment:Both Knees/1999/2000   Broken Hip                                       2015           Comment:Right Hip  BMI    Body Mass Index   36.11 kg/m 2    Patient reports pediatric lumbar surgery to "relieve pressure in her back"  Reproductive/Obstetrics negative OB ROS                             Anesthesia Physical Anesthesia Plan  ASA: III  Anesthesia Plan: General ETT   Post-op Pain Management:    Induction:   Airway Management Planned:   Additional Equipment:   Intra-op Plan:   Post-operative Plan:   Informed Consent: I have reviewed the patients History and Physical, chart, labs and discussed the procedure including the risks, benefits and alternatives for the proposed anesthesia with the patient or authorized representative who has indicated his/her understanding and acceptance.   Dental Advisory Given  Plan Discussed with: Anesthesiologist, CRNA and Surgeon  Anesthesia Plan Comments:         Anesthesia Quick Evaluation

## 2015-02-21 NOTE — Progress Notes (Signed)
Patient post op hip surgery and unable to maintain sufficient blood pressure of SBP >100 and is complaining of pain medications.  Paged [facility] for parameters on how to proceed with pain medication with low blood pressures.  Pending call back.

## 2015-02-21 NOTE — Assessment & Plan Note (Addendum)
Seeing Dr. Kirke CorinArida soon to establish care. Unknown history. Pt had cardiologist (?) [place] for [place]. Pt taking Lasix daily. Asked her to cut in half to keep BP higher than it is currently. Continue to watch weights and take whole tablet if worsening swelling.

## 2015-02-21 NOTE — Assessment & Plan Note (Addendum)
Major peripheral vascular disease of both lower extremities. Will treat for lower extremity stasis dermatitis and obtain home health care for further care.

## 2015-02-21 NOTE — [place] FL2 (Signed)
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[place])   Facility and Address:  [facility], [address], [place], [place] [address]      Provider Number: [id number]  Attending Physician Name and Address:  Enid Baasadhika Kalisetti, MD  Relative Name and Phone Number:       Current Level of Care: Hospital Recommended Level of Care: Skilled Nursing Facility Prior Approval Number:    Date Approved/Denied:   PASRR Number: [id number]  Discharge Plan: SNF    Current Diagnoses: Patient Active Problem List   Diagnosis Date Noted  . Stasis dermatitis of both legs 02/21/2015  . PVD (peripheral vascular disease) (HCC) 02/21/2015  . Hip fracture (HCC) 02/20/2015  . Chronic pain syndrome 01/21/2015  . Depression with anxiety 01/02/2015  . Encounter to establish care 01/02/2015  . Sleep disturbance 01/02/2015  . Urinary incontinence 01/02/2015  . Hyperlipidemia 01/02/2015  . GERD (gastroesophageal reflux disease) 01/02/2015  . Arthritis 01/02/2015  . Congestive heart failure (HCC) 01/02/2015  . Vitamin D deficiency 01/02/2015  . COPD (chronic obstructive pulmonary disease) (HCC) 01/02/2015  Hip Surgery: 02/21/15  Orientation ACTIVITIES/SOCIAL BLADDER RESPIRATION    Self, Time, Situation, Place  Active Continent Normal  BEHAVIORAL SYMPTOMS/MOOD NEUROLOGICAL BOWEL NUTRITION STATUS   (none)  (none) Continent Diet (npo currently for surgery)  PHYSICIAN VISITS COMMUNICATION OF NEEDS Height & Weight Skin  30 days Verbally   217 lbs. Surgical wounds          AMBULATORY STATUS RESPIRATION    Assist extensive Normal      Personal Care Assistance Level of Assistance  Bathing, Dressing Bathing Assistance: Limited assistance   Dressing Assistance: Limited assistance      Functional  Limitations Info    Sight Info: Adequate           SPECIAL CARE FACTORS FREQUENCY  PT (By licensed PT)                   Additional Factors Info  Code Status, Allergies Code Status Info:  (DNR) Allergies Info: pcns           Current Medications (02/21/2015): Current Facility-Administered Medications  Medication Dose Route Frequency Provider Last Rate Last Dose  . 0.9 %  sodium chloride infusion   Intravenous Continuous Delfino LovettVipul Shah, MD 75 mL/hr at 02/20/15 1913    . acidophilus (RISAQUAD) capsule 1 capsule  1 capsule Oral Daily Enid Baasadhika Kalisetti, MD   1 capsule at 02/21/15 1130  . busPIRone (BUSPAR) tablet 5 mg  5 mg Oral BID Delfino LovettVipul Shah, MD   5 mg at 02/20/15 2256  . clindamycin (CLEOCIN) IVPB 600 mg  600 mg Intravenous 3 times per day Delfino LovettVipul Shah, MD 100 mL/hr at 02/21/15 0613 600 mg at 02/21/15 1110  . darifenacin (ENABLEX) 24 hr tablet 7.5 mg  7.5 mg Oral Daily Delfino LovettVipul Shah, MD   7.5 mg at 02/20/15 2024  . docusate sodium (COLACE) capsule 100 mg  100 mg Oral BID Delfino LovettVipul Shah, MD   100 mg at 02/20/15 2256  . escitalopram (LEXAPRO) tablet 10 mg  10 mg Oral Daily Delfino LovettVipul Shah, MD   10 mg at 02/20/15 2024  . fluticasone (FLONASE) 50 MCG/ACT nasal spray 2 spray  2 spray Each Nare Daily PRN Delfino LovettVipul Shah, MD      . morphine 2 MG/ML injection 2  mg  2 mg Intravenous Q1H PRN Juanell Fairly, MD   2 mg at 02/21/15 5621  . oxyCODONE (Oxy IR/ROXICODONE) immediate release tablet 5-10 mg  5-10 mg Oral Q3H PRN Juanell Fairly, MD   5 mg at 02/20/15 2256  . pantoprazole (PROTONIX) EC tablet 40 mg  40 mg Oral Daily Delfino Lovett, MD   40 mg at 02/20/15 2024  . rOPINIRole (REQUIP) tablet 2 mg  2 mg Oral TID Delfino Lovett, MD   2 mg at 02/20/15 2256  . senna (SENOKOT) tablet 8.6 mg  1 tablet Oral BID Delfino Lovett, MD   8.6 mg at 02/20/15 2256  . simvastatin (ZOCOR) tablet 20 mg  20 mg Oral QHS Delfino Lovett, MD   20 mg at 02/20/15 2256  . tamsulosin (FLOMAX) capsule 0.4 mg  0.4 mg Oral QHS Delfino Lovett, MD   0.4  mg at 02/20/15 2256  . traZODone (DESYREL) tablet 50 mg  50 mg Oral QHS Delfino Lovett, MD   50 mg at 02/20/15 2256  . zolpidem (AMBIEN) tablet 5 mg  5 mg Oral QHS PRN Delfino Lovett, MD       Facility-Administered Medications Ordered in Other Encounters  Medication Dose Route Frequency Provider Last Rate Last Dose  . ePHEDrine injection   Intravenous Anesthesia Intra-op Ginger Carne, CRNA   5 mg at 02/21/15 1134  . fentaNYL (SUBLIMAZE) injection    Anesthesia Intra-op Junious Silk, CRNA   50 mcg at 02/21/15 1054  . lidocaine (cardiac) 100 mg/1ml (XYLOCAINE) 20 MG/ML injection 2%   Intravenous Anesthesia Intra-op Junious Silk, CRNA   60 mg at 02/21/15 1055  . phenylephrine (NEO-SYNEPHRINE) injection   Intravenous Anesthesia Intra-op Ginger Carne, CRNA   100 mcg at 02/21/15 1139  . propofol (DIPRIVAN) 10 mg/mL bolus/IV push    Anesthesia Intra-op Junious Silk, CRNA   80 mg at 02/21/15 1057  . rocuronium [facility]) injection    Anesthesia Intra-op Ginger Carne, CRNA   10 mg at 02/21/15 1121  . succinylcholine (ANECTINE) injection   Intravenous Anesthesia Intra-op Junious Silk, CRNA   100 mg at 02/21/15 1056   Do not use this list as official medication orders. Please verify with discharge summary.  Discharge Medications:   Medication List    ASK your doctor about these medications        azithromycin 250 MG tablet  Commonly known as:  ZITHROMAX  Take 2 tablets by mouth on day 1, take 1 tablet by mouth each day after for 4 days.     bacitracin ointment  Apply to affected area two times per day for 10 days.     busPIRone 5 MG tablet  Commonly known as:  BUSPAR  Take 1 tablet (5 mg total) by mouth 2 (two) times daily.     ergocalciferol 50000 UNITS capsule  Commonly known as:  VITAMIN D2  Take 1 capsule (50,000 Units total) by mouth once a week.     escitalopram 10 MG tablet  Commonly known as:  LEXAPRO  Take 1 tablet (10 mg total) by mouth daily.     fluticasone 50 MCG/ACT nasal  spray  Commonly known as:  FLONASE  Place 2 sprays into both nostrils daily as needed for allergies or rhinitis.     furosemide 40 MG tablet  Commonly known as:  LASIX  Take 1 tablet (40 mg total) by mouth daily.     meloxicam 15 MG tablet  Commonly known as:  MOBIC  Take 1 tablet (  15 mg total) by mouth daily.     oxyCODONE 5 MG immediate release tablet  Commonly known as:  Oxy IR/ROXICODONE  Take 1 tablet (5 mg total) by mouth every 6 (six) hours as needed for severe pain.     pantoprazole 20 MG tablet  Commonly known as:  PROTONIX  Take 1 tablet (20 mg total) by mouth daily.     Potassium Chloride ER 20 MEQ Tbcr  Take 40 mEq by mouth daily with breakfast.     predniSONE 5 MG tablet  Commonly known as:  DELTASONE  Take 1 tablet (5 mg total) by mouth daily with breakfast.     rOPINIRole 2 MG tablet  Commonly known as:  REQUIP  Take 1 tablet (2 mg total) by mouth 3 (three) times daily.     simvastatin 20 MG tablet  Commonly known as:  ZOCOR  Take 1 tablet (20 mg total) by mouth at bedtime.     solifenacin 10 MG tablet  Commonly known as:  VESICARE  Take 1 tablet (10 mg total) by mouth daily.     tamsulosin 0.4 MG Caps capsule  Commonly known as:  FLOMAX  Take 1 capsule (0.4 mg total) by mouth at bedtime.     traZODone 50 MG tablet  Commonly known as:  DESYREL  Take 1 tablet (50 mg total) by mouth at bedtime.        Relevant Imaging Results:  Relevant Lab Results:  Recent Labs    Additional Information    York Spaniel, LCSW

## 2015-02-21 NOTE — Transfer of Care (Signed)
Immediate Anesthesia Transfer of Care Note  Patient: Jennifer Olson  Procedure(s) Performed: Procedure(s): INTRAMEDULLARY (IM) NAIL FEMORAL (Left)  Patient Location: PACU  Anesthesia Type:General  Level of Consciousness: sedated  Airway & Oxygen Therapy: Patient Spontanous Breathing and Patient connected to face mask oxygen  Post-op Assessment: Report given to RN and Post -op Vital signs reviewed and stable  Post vital signs: Reviewed and stable  Last Vitals:  Filed Vitals:   02/21/15 1313  BP: 66/49  Pulse: 88  Temp: 36.4 C  Resp: 19    Complications: No apparent anesthesia complications

## 2015-02-21 NOTE — Anesthesia Postprocedure Evaluation (Signed)
  Anesthesia Post-op Note  Patient: Jennifer Olson  Procedure(s) Performed: Procedure(s): INTRAMEDULLARY (IM) NAIL FEMORAL (Left)  Anesthesia type:General ETT  Patient location: PACU  Post pain: Pain level controlled  Post assessment: Post-op Vital signs reviewed, Patient's Cardiovascular Status Stable, Respiratory Function Stable, Patent Airway and No signs of Nausea or vomiting  Post vital signs: Reviewed and stable  Last Vitals:  Filed Vitals:   02/21/15 1606  BP: 99/72  Pulse: 82  Temp:   Resp: 15    Level of consciousness: awake, alert  and patient cooperative  Complications: No apparent anesthesia complications

## 2015-02-21 NOTE — Assessment & Plan Note (Addendum)
Bilateral stasis dermatitis. Dr. Dan HumphreysWalker was consulted and she would try Eucerin with triamcinolone 0.1% together twice daily. Pt willing to try. Also will try nystatin on rashes of the back that look to be fungal in nature.   Will give Z-pack for possible cellulitis of legs. Will ask pt to keep legs elevated as much as possible.   Home Health RN for leg care order was placed

## 2015-02-21 NOTE — Plan of Care (Signed)
Problem: Consults Goal: Diagnosis- Total Joint Replacement Outcome: Completed/Met Date Met:  02/21/15 Hemiarthroplasty  Problem: Phase I Progression Outcomes Goal: Pain controlled with appropriate interventions Outcome: Progressing See new orders Goal: Dangle or out of bed evening of surgery Outcome: Not Met (add Reason) N/A Per Dr. Poggi Goal: Hemodynamically stable Outcome: Not Progressing Decrease in Hgb. From 11 to 9.2  Problem: Phase II Progression Outcomes Goal: Tolerating diet Outcome: Progressing Advanced from clear liquids to regular diet after surgery.     

## 2015-02-21 NOTE — Clinical Social Work Note (Signed)
The nursing home [place] has returned CSW phone call and verified that patient had been on medicaid for quite some time prior to discharging and thus had recouped her medicare days. CSW has left patient's daughter a message regarding this news. Will follow up with bed offers for patient. York SpanielMonica Rey Olson MSW,LCSW [phone]

## 2015-02-21 NOTE — Assessment & Plan Note (Signed)
Sent to Pain Management for further work up and treatment

## 2015-02-21 NOTE — Progress Notes (Addendum)
Spoke with Dr. Theador HawthorneVaichute and she advised that since patient has history of CHF we need to cautiously give fluids to achieve a MAP of 65 or greater before giving pain medication.  Bolus of 250 given, new fluid rate increased from 75 to 125 ml/hr, however was advised if patient starts to become short of breath or has wheezing we need to contact MD.  Hemoglobin repeated.  Also advised to give oxycodone since it is po if possible to achieve pain relief verse using IV pain medication to avoid dropping blood pressure further.

## 2015-02-21 NOTE — Progress Notes (Signed)
Initial Nutrition Assessment  DOCUMENTATION CODES:      INTERVENTION:  Meals and snacks: Await diet progression following surgery   NUTRITION DIAGNOSIS:   Inadequate oral intake related to acute illness as evidenced by NPO status.    GOAL:   Patient will meet greater than or equal to 90% of their needs    MONITOR:    (Energy intake, )  REASON FOR ASSESSMENT:   Consult Assessment of nutrition requirement/status  ASSESSMENT:      Pt admitted following a fall, now with left hip fracture and in surgery during visit this pm  Past Medical History  Diagnosis Date  . Arthritis   . Depression   . History of fainting spells of unknown cause   . Migraines   . Blood transfusion without reported diagnosis   . UTI (urinary tract infection)   . Urinary incontinence   . Aneurysm of abdominal aorta (HCC)   . Aneurysm artery, iliac (HCC)     Current Nutrition: NPO  Food/Nutrition-Related History:  Unable to address with pt as in surgery. Malnutrition screening reveals normal intake prior to admission    Scheduled Medications:  . acidophilus  1 capsule Oral Daily  . busPIRone  5 mg Oral BID  . clindamycin (CLEOCIN) IV  600 mg Intravenous 3 times per day  . darifenacin  7.5 mg Oral Daily  . docusate sodium  100 mg Oral BID  . escitalopram  10 mg Oral Daily  . pantoprazole  40 mg Oral Daily  . rOPINIRole  2 mg Oral TID  . senna  1 tablet Oral BID  . simvastatin  20 mg Oral QHS  . tamsulosin  0.4 mg Oral QHS  . traZODone  50 mg Oral QHS    Continuous Medications:  . sodium chloride 75 mL/hr at 02/20/15 1913     Electrolyte/Renal Profile and Glucose Profile:   Recent Labs Lab 02/20/15 1502 02/21/15 0609  NA 141 141  K 4.1 3.5  CL 97* 102  CO2 33* 33*  BUN 22* 19  CREATININE 1.28* 1.10*  CALCIUM 8.9 8.2*  MG 1.9  --   GLUCOSE 147* 83   Protein Profile:  Recent Labs Lab 02/20/15 1502  ALBUMIN 3.5    Gastrointestinal Profile: Last BM:  10/27   Nutrition-Focused Physical Exam Findings:  Unable to complete Nutrition-Focused physical exam at this time.     Weight Change: wt gain noted per wt encounters    Diet Order:  Diet NPO time specified  Skin:   reviewed   Height:   Ht Readings from Last 1 Encounters:  02/20/15 5\' 5"  (1.651 m)    Weight:   Wt Readings from Last 1 Encounters:  02/20/15 217 lb (98.431 kg)     BMI:  Body mass index is 36.11 kg/(m^2).  EDUCATION NEEDS:   No education needs identified at this time  LOW Care Level, with current information  Adonus Uselman B. Freida BusmanAllen, RD, LDN [phone] (pager)

## 2015-02-21 NOTE — Progress Notes (Signed)
Subjective:  POST OP CHECK:  Patient has just reached the floor from the PACU. She had a prolonged PACU stay due to hypotension. She had a systolic blood pressure in the 90s. Patient was given fluid support. Her postoperative hematocrit was 11.  Patient was having pain in her lower back which has improved. Patient reports left hip pain is mild.    Objective:   VITALS:   Filed Vitals:   02/21/15 1615 02/21/15 1617 02/21/15 1621 02/21/15 1637  BP: 112/55   101/54  Pulse: 84 83 84 84  Temp:  100.3 F (37.9 C)  98.3 F (36.8 C)  TempSrc:    Oral  Resp: 21 14 18    Height:      Weight:      SpO2: 97% 96% 96% 93%    PHYSICAL EXAM:  Left lower extremity: Patient's dressings have mild sanguinous drainage. Her thigh compartments are soft and compressible. She has no erythema or ecchymosis or significant swelling. She has intact sensation light touch in palpable pedal pulses. She has intact motor function in the left lower extremity.   LABS  Results for orders placed or performed during the hospital encounter of 02/20/15 (from the past 24 hour(s))  MRSA PCR Screening     Status: None   Collection Time: 02/20/15  6:15 PM  Result Value Ref Range   MRSA by PCR NEGATIVE NEGATIVE  Lactic acid, plasma     Status: None   Collection Time: 02/20/15  8:15 PM  Result Value Ref Range   Lactic Acid, Venous 1.8 0.5 - 2.0 mmol/L  CBC     Status: Abnormal   Collection Time: 02/21/15  6:09 AM  Result Value Ref Range   WBC 7.9 3.6 - 11.0 K/uL   RBC 3.42 (L) 3.80 - 5.20 MIL/uL   Hemoglobin 11.0 (L) 12.0 - 16.0 g/dL   HCT 40.933.1 (L) 81.135.0 - 91.447.0 %   MCV 96.8 80.0 - 100.0 fL   MCH 32.1 26.0 - 34.0 pg   MCHC 33.2 32.0 - 36.0 g/dL   RDW 78.213.7 95.611.5 - 21.314.5 %   Platelets 187 150 - 440 K/uL  Basic metabolic panel     Status: Abnormal   Collection Time: 02/21/15  6:09 AM  Result Value Ref Range   Sodium 141 135 - 145 mmol/L   Potassium 3.5 3.5 - 5.1 mmol/L   Chloride 102 101 - 111 mmol/L   CO2 33 (H)  22 - 32 mmol/L   Glucose, Bld 83 65 - 99 mg/dL   BUN 19 6 - 20 mg/dL   Creatinine, Ser 0.861.10 (H) 0.44 - 1.00 mg/dL   Calcium 8.2 (L) 8.9 - 10.3 mg/dL   GFR calc non Af Amer 44 (L) >60 mL/min   GFR calc Af Amer 51 (L) >60 mL/min   Anion gap 6 5 - 15  Glucose, capillary     Status: None   Collection Time: 02/21/15  7:36 AM  Result Value Ref Range   Glucose-Capillary 87 65 - 99 mg/dL   Comment 1 Notify RN   CBC with Differential     Status: Abnormal   Collection Time: 02/21/15  3:00 PM  Result Value Ref Range   WBC 17.0 (H) 3.6 - 11.0 K/uL   RBC 3.38 (L) 3.80 - 5.20 MIL/uL   Hemoglobin 11.0 (L) 12.0 - 16.0 g/dL   HCT 57.833.1 (L) 46.935.0 - 62.947.0 %   MCV 97.9 80.0 - 100.0 fL   MCH 32.5 26.0 -  34.0 pg   MCHC 33.2 32.0 - 36.0 g/dL   RDW 16.1 09.6 - 04.5 %   Platelets 195 150 - 440 K/uL   Neutrophils Relative % 93 %   Neutro Abs 15.7 (H) 1.4 - 6.5 K/uL   Lymphocytes Relative 4 %   Lymphs Abs 0.7 (L) 1.0 - 3.6 K/uL   Monocytes Relative 3 %   Monocytes Absolute 0.5 0.2 - 0.9 K/uL   Eosinophils Relative 0 %   Eosinophils Absolute 0.0 0 - 0.7 K/uL   Basophils Relative 0 %   Basophils Absolute 0.1 0 - 0.1 K/uL  Glucose, capillary     Status: Abnormal   Collection Time: 02/21/15  4:38 PM  Result Value Ref Range   Glucose-Capillary 131 (H) 65 - 99 mg/dL   Comment 1 Notify RN     Dg Chest Port 1 View  02/20/2015  CLINICAL DATA:  Tripped over area rug and fell at home. Left femur fracture. Preop. EXAM: PORTABLE CHEST 1 VIEW COMPARISON:  None. FINDINGS: The cardiac silhouette is upper limits of normal to mildly enlarged in size. Thoracic aortic calcification is noted. No airspace consolidation, edema, pleural effusion, or pneumothorax is identified. There is minimal left basilar scarring. Moderate thoracic dextroscoliosis is present. IMPRESSION: 1. No evidence of acute airspace disease. 2. Borderline cardiomegaly. 3. Moderate thoracic dextroscoliosis. Electronically Signed   By: Sebastian Ache M.D.    On: 02/20/2015 15:43   Dg C-arm 1-60 Min  02/21/2015  CLINICAL DATA:  Left femoral fracture repair EXAM: LEFT FEMUR 2 VIEWS; DG C-ARM 61-120 MIN COMPARISON:  02/20/2015 FINDINGS: Four views of the left femur submitted. The patient is status post left femoral intertrochanteric fracture repair. There is intra medullary rod and fixation pin is noted along femoral neck. There is anatomic alignment. Two metallic fixation screws are noted in distal femur. IMPRESSION: Status post intraoperative repair of left femoral intertrochanteric fracture. There is intra medullary rod and fixation pin is noted in left femur. There is anatomic alignment. Fluoroscopy time 1 minutes 57 seconds. Electronically Signed   By: Natasha Mead M.D.   On: 02/21/2015 13:07   Dg Hip Unilat With Pelvis 2-3 Views Left  02/20/2015  CLINICAL DATA:  Fall with left hip pain. EXAM: DG HIP (WITH OR WITHOUT PELVIS) 2-3V LEFT COMPARISON:  Pelvis 01/16/2015 FINDINGS: There is a displaced and mildly angulated left femoral intertrochanteric fracture. Limited evaluation of the left femoral neck. Left femoral head appears to be located. Stable postsurgical changes in the proximal right femur with an intramedullary rod and hip screw. Again noted is an aortic stent graft with vascular coils in the left internal iliac artery region. Pelvic bony ring appears to be intact. IMPRESSION: Left femoral intertrochanteric fracture. Electronically Signed   By: Richarda Overlie M.D.   On: 02/20/2015 15:45   Dg Femur Min 2 Views Left  02/21/2015  CLINICAL DATA:  Left femoral fracture repair EXAM: LEFT FEMUR 2 VIEWS; DG C-ARM 61-120 MIN COMPARISON:  02/20/2015 FINDINGS: Four views of the left femur submitted. The patient is status post left femoral intertrochanteric fracture repair. There is intra medullary rod and fixation pin is noted along femoral neck. There is anatomic alignment. Two metallic fixation screws are noted in distal femur. IMPRESSION: Status post  intraoperative repair of left femoral intertrochanteric fracture. There is intra medullary rod and fixation pin is noted in left femur. There is anatomic alignment. Fluoroscopy time 1 minutes 57 seconds. Electronically Signed   By: Lanette Hampshire.D.  On: 02/21/2015 13:07   Dg Femur Port Min 2 Views Left  02/21/2015  CLINICAL DATA:  Left femur fracture EXAM: LEFT FEMUR PORTABLE 2 VIEWS COMPARISON:  02/20/1999 60 FINDINGS: An intra medullary rod and dynamic compression screw now transfix the intertrochanteric proximal left femur fracture. There are 2 distal interlocking screws. Left total knee arthroplasty is anatomically aligned. No breakage or loosening of the hardware. IMPRESSION: ORIF left intertrochanteric femur fracture anatomically aligned. Electronically Signed   By: Jolaine Click M.D.   On: 02/21/2015 14:29    Assessment/Plan: Day of Surgery   Active Problems:   Hip fracture [facility])  I reviewed the postoperative x-rays from the PACU. The patient has acceptable alignment of the left hip fracture on all views. She had a comminuted reverse obliquity fracture with significant displacement.  Patient will be 25% partial weightbearing on the left lower extremity given the comminution and instability of her fracture.  Patient will continue 24 hours postop antibiotics. She'll continue to elevate the left lower extremity. Ice should be applied to the operative site. Patient should be encouraged to use incentive spirometry while awake. She'll begin Lovenox tomorrow for DVT prophylaxis. Continue TED stockings and foot pumps.   Juanell Fairly , MD 02/21/2015, 4:42 PM

## 2015-02-21 NOTE — Progress Notes (Signed)
Pt returned from PACU at approx 430pm. Resting comfortably, denies pain at this time. Diet ordered. TELE contacted. Per Dr. Martha ClanKrasinski, Lovenox to begin tomorrow AM.  Family notified of pt. return to floor.

## 2015-02-21 NOTE — Op Note (Signed)
DATE OF SURGERY:  02/21/2015  TIME: 4:17 PM  PATIENT NAME:  Jennifer Olson  AGE: 81 y.o.  PRE-OPERATIVE DIAGNOSIS:  fractured left hip  POST-OPERATIVE DIAGNOSIS:  SAME  PROCEDURE:  INTRAMEDULLARY (IM) NAIL FEMORAL  SURGEON:  Tahjir Silveria  OPERATIVE IMPLANTS: Biomet long Affixus nail 11 x 400, 130 degree, 105 mm lag screw with 50 and 46  mm distal interlocking screws  PREOPERATIVE INDICATIONS:  Jennifer Olson is a 81 y.o. year old who fell and suffered a hip fracture. She was brought into the ER and then admitted and medically cleared for surgical intervention.    The risks, benefits and alternatives were discussed with the patient and their family.  The risks include but are not limited to infection, bleeding, nerve or blood vessel injury, malunion, nonunion, hardware prominence, hardware failure, change in leg lengths or lower extremity rotation need for further surgery including hardware removal with conversion to a total hip arthroplasty. Medical risks include but are not limited to DVT and pulmonary embolism, myocardial infarction, stroke, pneumonia, respiratory failure and death. The patient and their family understood these risks and wished to proceed with surgery.  OPERATIVE PROCEDURE:  The patient was brought to the operating room and placed in the supine position on the fracture table. General anesthesia was administered.  A closed reduction was performed under C-arm guidance.  The fracture reduction was confirmed on both AP and lateral views. A time out was performed to verify the patient's name, date of birth, medical record number, correct site of surgery correct procedure to be performed. The timeout was also used to verify the patient received antibiotics and all appropriate instruments, implants and radiographic studies were available in the room. Once all in attendance were in agreement, the case began. The patient was prepped and draped in a sterile fashion. She received IV  clindamycin prior to the onset of the case.  An incision was made proximal to the greater trochanter in line with the femur. A guidewire was placed over the tip of the greater trochanter and advanced into the proximal femur to the level of the lesser trochanter.  Confirmation of the drill pin position was made on AP and lateral C-arm images.  The threaded guidepin was then overdrilled with the proximal femoral drill.  A long ball-tipped guidewire was then introduced through this entry hole and advanced across the fracture site and into the distal femur. Its position both proximally and distally was confirmed on AP and lateral C-arm images. The length of the nail was determined with the depth gauge. It was measured to 400 mm in length. Sequential reamers were advanced into the femoral canal upto 13mm. An 11 x 400 mm 130 nail was then inserted into the proximal femur, across the fracture site and into the femoral shaft. Its position both proximally and distally was confirmed on AP and lateral C-arm images.   Once the nail was completely seated, the drill guide for the lag screw was placed through the guide arm for the Affixus nail. A guidepin was then placed through this drill guide and advanced through the lateral cortex of the femur, across the fracture site and into the femoral head achieving a tip apex distance of less than 25 mm. The length of the drill pin was measured to 105 mm.  The drill for the lag screw was advanced through the lateral cortex, across the fracture site and up into the femoral head to the depth of 105 mm.  The lag screw was  then advanced by hand into position across the fracture site into the femoral head. Its final position was confirmed on AP and lateral C-arm images. Compression was applied as traction was carefully released. The set screw at the top of the intramedullary rod was tightened by hand using a screwdriver. It was backed off a quarter turn to allow for compression at the  fracture site during weightbearing.  Attention was then turned to placement of the distal interlocking screw. A perfect circle technique was used. Both distal interlocking holes were drilled with a freehand technique after a stab incision and then made in the skin for both screws. The depth of these interlocking screws were determined with a depth gauge. They were measured to be 50 and 46 mm in length. The screws were advanced into position and tightened by hand. The final position of the distal interlocking screws were confirmed on AP and lateral C-arm images.  The 3 incisions were irrigated copiously and closed with 0 Vicryl for closure of the deep fascia and 2-0 Vicryl for subcutaneous closure. The skin was approximated with staples. A dry sterile dressing was applied. I was scrubbed and present the entire case and all sharp and instrument counts were correct at the conclusion of the case. Patient was transferred to hospital bed and brought to PACU in stable condition. I spoke with the patient's family in the postop consultation room to let them know the case had been performed without complication and the patient was stable in the recovery room.   Jennifer DevoidKevin L Dianelly Ferran, MD

## 2015-02-21 NOTE — Clinical Social Work Note (Signed)
Clinical Social Work Assessment  Patient Details  Name: Jennifer Olson MRN: [id number] Date of Birth: 07/23/1925  Date of referral:  02/21/15               Reason for consult:  Facility Placement                Permission sought to share information with:   (Patient in surgery for hip fracture) Permission granted to share information::     Name::        Agency::     Relationship::     Contact Information:     Housing/Transportation Living arrangements for the past 2 months:  Single Family Home Source of Information:  Adult Children Patient Interpreter Needed:  None Criminal Activity/Legal Involvement Pertinent to Current Situation/Hospitalization:  No - Comment as needed Significant Relationships:  Adult Children Lives with:  Self Do you feel safe going back to the place where you live?   (paitent unable to answer as she is in surgery) Need for family participation in patient care:     Care giving concerns:  Lives with daughter: [name].   Social Worker assessment / plan:  CSW contacted by patient's daughter to request short term rehab at discharge and to request preference of [facility]. Patient [place] as patient's daughter accepted a job as the Optician, dispensingminister of music at [employer]. Patient is residing with her daughter. Patient has fallen and broken her hip and is having surgery today. It is anticipated she will require STR at discharge.   Patient's daughter informed CSW that patient was discharged [facility]th [facility], [place] nursing home in [place] (where patient and daughter moved from recently). Patient's daughter admitted that patient had remained at that skilled facility and used up her medicare days and went into SNF medicaid. It appears patient may not have any medicare days left. Patient  has not been out of a facility for 60 days and thus may not have recouped  any medicare days. CSW has left a message for [facility]  admissions coordinator to return my call to determine the exact dates she was there.   Employment status:  Retired Health and safety inspectornsurance information:  Medicare PT Recommendations:  Not assessed at this time Information / Referral to community resources:     Patient/Family's Response to care:  Daughter expressed appreciation for CSW assistance.  Patient/Family's Understanding of and Emotional Response to Diagnosis, Current Treatment, and Prognosis:  Patient's daughter expressed concern over the possibility that patient may not have medicare days left to enter into STR.  Emotional Assessment Appearance:   (unable to assess) Attitude/Demeanor/Rapport:   (unable to assess) Affect (typically observed):  Unable to Assess Orientation:    Alcohol / Substance use:  Not Applicable Psych involvement (Current and /or in the community):  No (Comment)  Discharge Needs  Concerns to be addressed:  Care Coordination, Financial / Insurance Concerns Readmission within the last 30 days:  No Current discharge risk:  None Barriers to Discharge:  No Barriers Identified   York SpanielMonica Shemika Robbs, LCSW 02/21/2015, 11:43 AM

## 2015-02-21 NOTE — Clinical Social Work Note (Signed)
[facility]ennifer Olson has offered to take patient and patient's daughter is aware. Will facilitate discharge to [facility] when time. York SpanielMonica Chick Cousins MSW,LCSW [phone]

## 2015-02-22 ENCOUNTER — Inpatient Hospital Stay: Payer: Medicare Other

## 2015-02-22 LAB — CBC
HCT: 24.5 % — ABNORMAL LOW (ref 35.0–47.0)
Hemoglobin: 8.1 g/dL — ABNORMAL LOW (ref 12.0–16.0)
MCH: 32.2 pg (ref 26.0–34.0)
MCHC: 33.2 g/dL (ref 32.0–36.0)
MCV: 97 fL (ref 80.0–100.0)
Platelets: 146 10*3/uL — ABNORMAL LOW (ref 150–440)
RBC: 2.53 MIL/uL — ABNORMAL LOW (ref 3.80–5.20)
RDW: 13.6 % (ref 11.5–14.5)
WBC: 10.9 10*3/uL (ref 3.6–11.0)

## 2015-02-22 LAB — GLUCOSE, CAPILLARY: GLUCOSE-CAPILLARY: 100 mg/dL — AB (ref 65–99)

## 2015-02-22 LAB — URINE CULTURE: CULTURE: NO GROWTH

## 2015-02-22 LAB — HEMOGLOBIN: HEMOGLOBIN: 7.8 g/dL — AB (ref 12.0–16.0)

## 2015-02-22 LAB — BASIC METABOLIC PANEL
ANION GAP: 5 (ref 5–15)
BUN: 16 mg/dL (ref 6–20)
CALCIUM: 7.6 mg/dL — AB (ref 8.9–10.3)
CO2: 29 mmol/L (ref 22–32)
CREATININE: 0.96 mg/dL (ref 0.44–1.00)
Chloride: 105 mmol/L (ref 101–111)
GFR, EST AFRICAN AMERICAN: 60 mL/min — AB (ref >60)
GFR, EST NON AFRICAN AMERICAN: 52 mL/min — AB (ref >60)
Glucose, Bld: 113 mg/dL — ABNORMAL HIGH (ref 65–99)
Potassium: 3.8 mmol/L (ref 3.5–5.1)
SODIUM: 139 mmol/L (ref 135–145)

## 2015-02-22 LAB — PREPARE RBC (CROSSMATCH)

## 2015-02-22 MED ORDER — SODIUM CHLORIDE 0.9 % IV SOLN
Freq: Once | INTRAVENOUS | Status: AC
  Administered 2015-02-22: 23:00:00 via INTRAVENOUS

## 2015-02-22 MED ORDER — SODIUM CHLORIDE 0.9 % IV BOLUS (SEPSIS)
500.0000 mL | Freq: Once | INTRAVENOUS | Status: AC
Start: 2015-02-22 — End: 2015-02-22
  Administered 2015-02-22: 500 mL via INTRAVENOUS

## 2015-02-22 MED ORDER — TRAMADOL HCL 50 MG PO TABS
50.0000 mg | ORAL_TABLET | ORAL | Status: AC
  Administered 2015-02-22: 50 mg via ORAL
  Filled 2015-02-22: qty 1

## 2015-02-22 MED ORDER — ACETAMINOPHEN 325 MG PO TABS
650.0000 mg | ORAL_TABLET | Freq: Once | ORAL | Status: AC
Start: 2015-02-22 — End: 2015-02-22
  Administered 2015-02-22: 650 mg via ORAL
  Filled 2015-02-22: qty 2

## 2015-02-22 MED ORDER — CLINDAMYCIN HCL 150 MG PO CAPS
300.0000 mg | ORAL_CAPSULE | Freq: Three times a day (TID) | ORAL | Status: DC
  Administered 2015-02-22 – 2015-02-24 (×7): 300 mg via ORAL
  Filled 2015-02-22 (×7): qty 2

## 2015-02-22 NOTE — Progress Notes (Signed)
Patients dressing was changed 2 hours ago and again is very bloody.[name]. i notified ortho who said not to recheck HGB at this time.  No new orders given

## 2015-02-22 NOTE — Progress Notes (Signed)
I spoke with dr Nemiah Commanderkalisetti about the pt's bleeding to her dressing.  Hgb to be checked now.  Pt to receive 1 unit of PRB's if HGB is less than 7.5

## 2015-02-22 NOTE — Progress Notes (Signed)
Physical Therapy Treatment Patient Details Name: Jennifer Olson MRN: [id number] DOB: 04-Dec-1925 Today's Date: 02/22/2015    History of Present Illness Pt here post fall, L hip fracture, ORIF    PT Comments    Pt continues to c/o severe back pain and asks not to try any standing/sitting/etc though she does agree to to do some bed exercises but is clearly uncomfortable t/o the session secondary to back pain.  She actually is able to do fairly well with L hip and LE exercises.  Pt reports that she will try to do some standing tomorrow but is concerned that her back will continue to be a problem.   Follow Up Recommendations  SNF     Equipment Recommendations       Recommendations for Other Services       Precautions / Restrictions Precautions Precautions: Fall Restrictions Weight Bearing Restrictions: Yes LLE Weight Bearing: Weight bearing as tolerated    Mobility  Bed Mobility Overal bed mobility: Needs Assistance Bed Mobility: Supine to Sit;Sit to Supine     Supine to sit: Max assist Sit to supine: Max assist   General bed mobility comments: deferred any mobiltiy acts  Transfers                    Ambulation/Gait                 Stairs            Wheelchair Mobility    Modified Rankin (Stroke Patients Only)       Balance                                    Cognition Arousal/Alertness: Awake/alert Behavior During Therapy: WFL for tasks assessed/performed Overall Cognitive Status: Within Functional Limits for tasks assessed                      Exercises General Exercises - Lower Extremity Ankle Circles/Pumps: AROM;Strengthening;15 reps Quad Sets: Strengthening;AROM;10 reps Gluteal Sets: Strengthening;10 reps Short Arc Quad: AROM;10 reps Heel Slides: AAROM;AROM;10 reps Hip ABduction/ADduction: AROM;10 reps    General Comments        Pertinent Vitals/Pain Pain Assessment: 0-10 Pain Score: 9  Pain  Location: low back Pain Intervention(s): Limited activity within patient's tolerance;Monitored during session;Premedicated before session    Home Living Family/patient expects to be discharged to:: Skilled nursing facility               Additional Comments: Pt has been living with daughter but has had 4 falls    Prior Function Level of Independence: Needs assistance    ADL's / Homemaking Assistance Needed: Needs assist with LB dressing, and UB dressing due to RUE limitations Comments: Apparently pt has been able to do some limited ambulation to/from the bathroom and other modest distances, but generally has not been able to do a lot.    PT Goals (current goals can now be found in the care plan section) Acute Rehab PT Goals Patient Stated Goal: "Get better"    Frequency  BID    PT Plan Current plan remains appropriate    Co-evaluation             End of Session Equipment Utilized During Treatment: Gait belt Activity Tolerance: Patient limited by pain;Patient limited by fatigue Patient left: with bed alarm set     Time: 1425-1440 PT Time Calculation (  min) (ACUTE ONLY): 15 min  Charges:  $Therapeutic Exercise: 8-22 mins                    G Codes:     Loran SentersGalen Turrell Severt, PT, DPT [phone]  Malachi ProGalen R Abdoul Encinas 02/22/2015, 3:41 PM

## 2015-02-22 NOTE — Care Management Note (Signed)
Case Management Note  Patient Details  Name: Jennifer AlkenGene Olson MRN: [id number] Date of Birth: 03/12/1926  Subjective/Objective:    PT recommending SNF. Case management will follow for discharge planning  if no SNF placement.                 Action/Plan:   Expected Discharge Date:                  Expected Discharge Plan:     In-House Referral:  Clinical Social Work  Discharge planning Services  CM Consult  Post Acute Care Choice:  Durable Medical Equipment, Home Health Choice offered to:  Patient  DME Arranged:    DME Agency:     HH Arranged:    HH Agency:     Status of Service:  In process, will continue to follow  Medicare Important Message Given:    Date Medicare IM Given:    Medicare IM give by:    Date Additional Medicare IM Given:    Additional Medicare Important Message give by:     If discussed at Long Length of Stay Meetings, dates discussed:    Additional Comments:  Tiger Spieker A, RN 02/22/2015, 3:52 PM

## 2015-02-22 NOTE — Progress Notes (Signed)
Patient ID: Jennifer AlkenGene Olson, female   DOB: 08/11/1925, 81 y.o.   MRN: [id number]  Patient is awake and alert. Complains mainly of back pain, but not hip. N-v exam intact. Moderate bloody drainage under dressing. Hgb 8.1, willl repeat in am. Continue with present orders.

## 2015-02-22 NOTE — Progress Notes (Signed)
[facility] [place] [facility]   PATIENT NAME: Jennifer AlkenGene Olson    MR#:  [id number]  DATE OF BIRTH:  06/13/1925  SUBJECTIVE:  CHIEF COMPLAINT:   Chief Complaint  Patient presents with  . Fall  . Hip Pain   - Postoperative day 1 after left hip fracture surgery. Patient seems to be in significant pain at this time. -Postoperatively she was noted to have hypotension requiring a fluid bolus. Even now the blood pressure is on the lower side -Right leg cellulitis improving  REVIEW OF SYSTEMS:  Review of Systems  Constitutional: Negative for fever and chills.  HENT: Negative for ear discharge, ear pain and nosebleeds.   Eyes: Negative for blurred vision and double vision.  Respiratory: Negative for cough, shortness of breath and wheezing.   Cardiovascular: Positive for leg swelling. Negative for chest pain and palpitations.  Gastrointestinal: Negative for nausea, vomiting, abdominal pain, diarrhea and constipation.  Genitourinary: Negative for dysuria.  Musculoskeletal: Positive for myalgias and joint pain.       Left hip pain  Skin: Positive for rash.  Neurological: Negative for dizziness, sensory change, speech change, focal weakness, seizures and headaches.  Psychiatric/Behavioral: Negative for depression.    DRUG ALLERGIES:   Allergies  Allergen Reactions  . Penicillins Rash    VITALS:  Blood pressure 100/60, pulse 90, temperature 98.5 F (36.9 C), temperature source Oral, resp. rate 18, height 5\' 5"  (1.651 m), weight 98.431 kg (217 lb), SpO2 98 %.  PHYSICAL EXAMINATION:  Physical Exam  GENERAL:  81 y.o.-year-old obese patient lying in the bed with no acute distress.  EYES: Pupils equal, round, reactive to light and accommodation. No scleral icterus. Extraocular muscles intact.  HEENT: Head atraumatic, normocephalic. Oropharynx and nasopharynx clear.  NECK:  Supple, no jugular venous distention. No thyroid enlargement, no tenderness.  LUNGS:  Normal breath sounds bilaterally, no wheezing, rales,rhonchi or crepitation. No use of accessory muscles of respiration. Decreased bibasilar breath sounds CARDIOVASCULAR: S1, S2 normal. No rubs, or gallops. 3/6 systolic murmur present ABDOMEN: Soft, nontender, nondistended. Bowel sounds present. No organomegaly or mass.  EXTREMITIES: Left dressing in place with dried blood saturating the dressing, 1+ edema present, right lower leg with 2+ edema, erythema with increased shiny skin from stretching noted. No  cyanosis, or clubbing.  NEUROLOGIC: Cranial nerves II through XII are intact. Muscle strength 5/5 in all extremities except lower extremities due to pain. Sensation intact. Gait not checked.  PSYCHIATRIC: The patient is alert and oriented x 3.  SKIN: No obvious rash, lesion, or ulcer other than the erythema from right leg cellulitis.    LABORATORY PANEL:   CBC  Recent Labs Lab 02/22/15 0355  WBC 10.9  HGB 8.1*  HCT 24.5*  PLT 146*   ------------------------------------------------------------------------------------------------------------------  Chemistries   Recent Labs Lab 02/20/15 1502  02/22/15 0355  NA 141  < > 139  K 4.1  < > 3.8  CL 97*  < > 105  CO2 33*  < > 29  GLUCOSE 147*  < > 113*  BUN 22*  < > 16  CREATININE 1.28*  < > 0.96  CALCIUM 8.9  < > 7.6*  MG 1.9  --   --   AST 23  --   --   ALT 14  --   --   ALKPHOS 197*  --   --   BILITOT 0.6  --   --   < > = values in this interval not displayed. ------------------------------------------------------------------------------------------------------------------  Cardiac Enzymes  Recent Labs Lab 02/20/15 1502  TROPONINI 0.03   ------------------------------------------------------------------------------------------------------------------  RADIOLOGY:  Dg Chest Port 1 View  02/20/2015  CLINICAL DATA:  Tripped over area rug and fell at home. Left femur fracture. Preop. EXAM: PORTABLE CHEST 1 VIEW  COMPARISON:  None. FINDINGS: The cardiac silhouette is upper limits of normal to mildly enlarged in size. Thoracic aortic calcification is noted. No airspace consolidation, edema, pleural effusion, or pneumothorax is identified. There is minimal left basilar scarring. Moderate thoracic dextroscoliosis is present. IMPRESSION: 1. No evidence of acute airspace disease. 2. Borderline cardiomegaly. 3. Moderate thoracic dextroscoliosis. Electronically Signed   By: Sebastian Ache M.D.   On: 02/20/2015 15:43   Dg C-arm 1-60 Min  02/21/2015  CLINICAL DATA:  Left femoral fracture repair EXAM: LEFT FEMUR 2 VIEWS; DG C-ARM 61-120 MIN COMPARISON:  02/20/2015 FINDINGS: Four views of the left femur submitted. The patient is status post left femoral intertrochanteric fracture repair. There is intra medullary rod and fixation pin is noted along femoral neck. There is anatomic alignment. Two metallic fixation screws are noted in distal femur. IMPRESSION: Status post intraoperative repair of left femoral intertrochanteric fracture. There is intra medullary rod and fixation pin is noted in left femur. There is anatomic alignment. Fluoroscopy time 1 minutes 57 seconds. Electronically Signed   By: Natasha Mead M.D.   On: 02/21/2015 13:07   Dg Hip Unilat With Pelvis 2-3 Views Left  02/20/2015  CLINICAL DATA:  Fall with left hip pain. EXAM: DG HIP (WITH OR WITHOUT PELVIS) 2-3V LEFT COMPARISON:  Pelvis 01/16/2015 FINDINGS: There is a displaced and mildly angulated left femoral intertrochanteric fracture. Limited evaluation of the left femoral neck. Left femoral head appears to be located. Stable postsurgical changes in the proximal right femur with an intramedullary rod and hip screw. Again noted is an aortic stent graft with vascular coils in the left internal iliac artery region. Pelvic bony ring appears to be intact. IMPRESSION: Left femoral intertrochanteric fracture. Electronically Signed   By: Richarda Overlie M.D.   On: 02/20/2015  15:45   Dg Femur Min 2 Views Left  02/21/2015  CLINICAL DATA:  Left femoral fracture repair EXAM: LEFT FEMUR 2 VIEWS; DG C-ARM 61-120 MIN COMPARISON:  02/20/2015 FINDINGS: Four views of the left femur submitted. The patient is status post left femoral intertrochanteric fracture repair. There is intra medullary rod and fixation pin is noted along femoral neck. There is anatomic alignment. Two metallic fixation screws are noted in distal femur. IMPRESSION: Status post intraoperative repair of left femoral intertrochanteric fracture. There is intra medullary rod and fixation pin is noted in left femur. There is anatomic alignment. Fluoroscopy time 1 minutes 57 seconds. Electronically Signed   By: Natasha Mead M.D.   On: 02/21/2015 13:07   Dg Femur Port Min 2 Views Left  02/21/2015  CLINICAL DATA:  Left femur fracture EXAM: LEFT FEMUR PORTABLE 2 VIEWS COMPARISON:  02/20/1999 60 FINDINGS: An intra medullary rod and dynamic compression screw now transfix the intertrochanteric proximal left femur fracture. There are 2 distal interlocking screws. Left total knee arthroplasty is anatomically aligned. No breakage or loosening of the hardware. IMPRESSION: ORIF left intertrochanteric femur fracture anatomically aligned. Electronically Signed   By: Jolaine Click M.D.   On: 02/21/2015 14:29    EKG:   Orders placed or performed during the hospital encounter of 02/20/15  . ED EKG  . ED EKG  . EKG 12-Lead  . EKG 12-Lead  . EKG 12-Lead  .  EKG 12-Lead    ASSESSMENT AND PLAN:   81 year old female with past medical history significant for arthritis, peripheral vascular disease, depression and anxiety presented to the hospital after a fall and noted to have left hip fracture.  #1 left hip fracture-appreciate orthopedic consult -Postoperative day 1 today. Foley discontinued -Pain management -Postoperative DVT prophylaxis with Lovenox started and physical therapy today  #2 hypotension-postoperatively likely from  blood and fluid loss during surgery. -Received fluid bolus, decrease her IV fluids to 75 cc per hour as at high risk for congestive heart failure exacerbation. -Monitor urine output. Encourage her to drink more fluids by mouth -Patient is not on any anti-hypertensives -Monitor, if needed will have to give another fluid bolus or order 1 unit transfusion  #3 postoperative acute on chronic anemia-hemoglobin was 11 prior to surgery and dropped down to 8. -Some oozing noted at the dressing site. To be looked by our 2. Recheck hemoglobin again tomorrow. -If less than 7.5, we'll transfuse her especially with her hypotension  #2 right leg cellulitis- changed to oral clindamycin today. Added a probiotic while on clindamycin. -Improving erythema and swelling. Ultrasound Doppler to rule out any DVT today  #3 depression and anxiety continue Lexapro.  #4 hyperlipidemia-on statin  #5 GERD-on Protonix  #6 DVT prophylaxis-started on Lovenox today.    All the records are reviewed and case discussed with Care Management/Social Workerr. Management plans discussed with the patient, family and they are in agreement.  CODE STATUS: DNR  TOTAL TIME TAKING CARE OF THIS PATIENT: 38 minutes.   POSSIBLE D/C IN 2-3 DAYS, DEPENDING ON CLINICAL CONDITION.   Enid Baas M.D on 02/22/2015 at 7:33 AM  Between 7am to 6pm - Pager - [phone]  After 6pm go to [email or web address] - password [email or web address] [facility]  [facility]  Office  [phone]  CC: Primary care physician; Carollee Leitz, NP

## 2015-02-22 NOTE — Evaluation (Signed)
Physical Therapy Evaluation Patient Details Name: Jennifer Olson MRN: [id number] DOB: [birth year] Today's Date: 02/22/2015   History of Present Illness  Pt here post fall, L hip fracture, ORIF  Clinical Impression  Pt is able to show good effort with 10 minutes of exercise apart from exam, but is very limited with mobility secondary to chronic low back pain limiting sitting and especially trying to get to standing.  3 attempts at standing with max assist and inability to get to upright.      Follow Up Recommendations SNF    Equipment Recommendations   (pt has walker)    Recommendations for Other Services       Precautions / Restrictions Precautions Precautions: Fall Restrictions Weight Bearing Restrictions: Yes LLE Weight Bearing: Weight bearing as tolerated      Mobility  Bed Mobility Overal bed mobility: Needs Assistance Bed Mobility: Supine to Sit;Sit to Supine     Supine to sit: Max assist Sit to supine: Max assist   General bed mobility comments: Pt shows good effort and is able to give some assistance, but ultiamtely needs considerable assist to get to/from EOB  Transfers Overall transfer level: Needs assistance Equipment used: Rolling walker (2 wheeled) Transfers: Sit to/from Stand Sit to Stand: Max assist         General transfer comment: 3 attempts at standing, but pt c/o too much back pain and ultimately is unable to get to full standing.   Ambulation/Gait             General Gait Details: unable, pt having too much LBP attempting to stand  Stairs            Wheelchair Mobility    Modified Rankin (Stroke Patients Only)       Balance                                             Pertinent Vitals/Pain Pain Assessment: 0-10 Pain Score: 8  (minimal pain at rest, most of the pain is low back pain) Pain Location: low back > hip    Home Living Family/patient expects to be discharged to:: Skilled nursing facility                  Additional Comments: Pt has been living with daughter but has had 4 falls in the last 2 months and generally has not been very safe for a while now.     Prior Function Level of Independence: Needs assistance         Comments: Apparently pt has been able to do some limited ambulation to/from the bathroom and other modest distances, but generally has not been able to do a lot.      Hand Dominance        Extremity/Trunk Assessment   Upper Extremity Assessment: Overall WFL for tasks assessed           Lower Extremity Assessment: LLE deficits/detail (Pt is able to do AROM on R, L LE 3/5 only)         Communication   Communication: No difficulties  Cognition Arousal/Alertness: Awake/alert Behavior During Therapy: WFL for tasks assessed/performed Overall Cognitive Status: Within Functional Limits for tasks assessed                      General Comments      Exercises  Assessment/Plan    PT Assessment Patient needs continued PT services  PT Diagnosis Difficulty walking;Generalized weakness   PT Problem List Decreased range of motion;Decreased strength;Decreased activity tolerance;Decreased balance;Decreased mobility;Decreased coordination;Decreased knowledge of use of DME;Decreased safety awareness  PT Treatment Interventions DME instruction;Gait training;Stair training;Functional mobility training;Therapeutic activities;Therapeutic exercise;Balance training   PT Goals (Current goals can be found in the Care Plan section) Acute Rehab PT Goals Patient Stated Goal: "I know I need to get stronger" PT Goal Formulation: With patient Time For Goal Achievement: 03/08/15 Potential to Achieve Goals: Fair    Frequency BID   Barriers to discharge        Co-evaluation               End of Session Equipment Utilized During Treatment: Gait belt Activity Tolerance: Patient limited by pain;Patient limited by fatigue Patient left:  with bed alarm set           Time: [id number] PT Time Calculation (min) (ACUTE ONLY): 28 min   Charges:   PT Evaluation $Initial PT Evaluation Tier I: 1 Procedure PT Treatments $Therapeutic Exercise: 8-22 mins   PT G Codes:       Loran SentersGalen Karron Goens, PT, DPT [phone]  Malachi ProGalen R Jamontae Thwaites 02/22/2015, 11:21 AM

## 2015-02-22 NOTE — Progress Notes (Signed)
rn phoned dr Nemiah Commanderkalisetti re; cardiac monitor. bp with low bp . md orders cardiac monitoring

## 2015-02-22 NOTE — Evaluation (Signed)
Occupational Therapy Evaluation Patient Details Name: Jennifer Olson MRN: [id number] DOB: 01-11-1926 Today's Date: 02/22/2015    History of Present Illness Pt here post fall, L hip fracture, ORIF   Clinical Impression   Patient was pleasant, but having a lot of pain. Pain level 7-8/10 even at rest. Patient states she was on 2 L O2 at home, but only at night and as needed during the day. Patient states she has had about 4 falls since she came home from SNF. Limited RUE ROM and strength from previous injury during fall several years ago. Patient states she is able to manage pants and bedroom shoes at home, but needs assist with shirts and sock/shoes.     Follow Up Recommendations  SNF    Equipment Recommendations       Recommendations for Other Services       Precautions / Restrictions Precautions Precautions: Fall Restrictions Weight Bearing Restrictions: Yes LLE Weight Bearing: Weight bearing as tolerated      Mobility Bed Mobility Overal bed mobility: Needs Assistance Bed Mobility: Supine to Sit;Sit to Supine     Supine to sit: Max assist Sit to supine: Max assist   General bed mobility comments: Pt shows good effort and is able to give some assistance, but ultiamtely needs considerable assist to get to/from EOB  Transfers Overall transfer level: Needs assistance Equipment used: Rolling walker (2 wheeled) Transfers: Sit to/from Stand Sit to Stand: Max assist         General transfer comment: 3 attempts at standing, but pt c/o too much back pain and ultimately is unable to get to full standing.     Balance                                            ADL Overall ADL's : Needs assistance/impaired                     Lower Body Dressing: Total assistance                       Vision     Perception     Praxis      Pertinent Vitals/Pain Pain Assessment: 0-10 Pain Score: 8  Pain Location: low back and L hip Pain  Intervention(s): Limited activity within patient's tolerance;Monitored during session;Premedicated before session     Hand Dominance Right   Extremity/Trunk Assessment Upper Extremity Assessment Upper Extremity Assessment: RUE deficits/detail RUE Deficits / Details: AROM ~60; PROM ~90; 3-/5 shoulder strength, 4-/5 elbow strength   Lower Extremity Assessment Lower Extremity Assessment: Defer to PT evaluation       Communication Communication Communication: No difficulties   Cognition Arousal/Alertness: Awake/alert Behavior During Therapy: WFL for tasks assessed/performed Overall Cognitive Status: Within Functional Limits for tasks assessed                     General Comments       Exercises       Shoulder Instructions      Home Living Family/patient expects to be discharged to:: Skilled nursing facility                                 Additional Comments: Pt has been living with daughter but has had 4 falls  Prior Functioning/Environment Level of Independence: Needs assistance    ADL's / Homemaking Assistance Needed: Needs assist with LB dressing, and UB dressing due to RUE limitations   Comments: Apparently pt has been able to do some limited ambulation to/from the bathroom and other modest distances, but generally has not been able to do a lot.     OT Diagnosis: Generalized weakness;Acute pain   OT Problem List: Decreased strength;Decreased range of motion;Decreased activity tolerance;Impaired balance (sitting and/or standing);Pain   OT Treatment/Interventions: Self-care/ADL training;Therapeutic activities;Therapeutic exercise;Patient/family education;DME and/or AE instruction;Balance training    OT Goals(Current goals can be found in the care plan section) Acute Rehab OT Goals Patient Stated Goal: "Get better" OT Goal Formulation: With patient Potential to Achieve Goals: Fair  OT Frequency: Min 1X/week   Barriers to D/C: Decreased  caregiver support          Co-evaluation              End of Session Nurse Communication: Mobility status  Activity Tolerance: Patient limited by pain;Patient limited by fatigue Patient left: in bed;with call bell/phone within reach;with bed alarm set   Time: 1035-1055 OT Time Calculation (min): 20 min Charges:  OT General Charges $OT Visit: 1 Procedure OT Evaluation $Initial OT Evaluation Tier I: 1 Procedure OT Treatments $Self Care/Home Management : 8-22 mins G-Codes:    Judah Chevere L 02/22/2015, 12:13 PM  Kirstie PeriWendy Shawntelle Ungar, OTR/L

## 2015-02-23 LAB — CBC
HEMATOCRIT: 24.7 % — AB (ref 35.0–47.0)
Hemoglobin: 8.2 g/dL — ABNORMAL LOW (ref 12.0–16.0)
MCH: 31.5 pg (ref 26.0–34.0)
MCHC: 33.3 g/dL (ref 32.0–36.0)
MCV: 94.6 fL (ref 80.0–100.0)
Platelets: 136 10*3/uL — ABNORMAL LOW (ref 150–440)
RBC: 2.61 MIL/uL — ABNORMAL LOW (ref 3.80–5.20)
RDW: 15.3 % — AB (ref 11.5–14.5)
WBC: 8.8 10*3/uL (ref 3.6–11.0)

## 2015-02-23 LAB — BASIC METABOLIC PANEL
ANION GAP: 5 (ref 5–15)
BUN: 17 mg/dL (ref 6–20)
CALCIUM: 7.9 mg/dL — AB (ref 8.9–10.3)
CHLORIDE: 106 mmol/L (ref 101–111)
CO2: 28 mmol/L (ref 22–32)
Creatinine, Ser: 0.95 mg/dL (ref 0.44–1.00)
GFR calc non Af Amer: 52 mL/min — ABNORMAL LOW (ref >60)
Glucose, Bld: 104 mg/dL — ABNORMAL HIGH (ref 65–99)
Potassium: 3.5 mmol/L (ref 3.5–5.1)
SODIUM: 139 mmol/L (ref 135–145)

## 2015-02-23 MED ORDER — TRAMADOL HCL 50 MG PO TABS
50.0000 mg | ORAL_TABLET | Freq: Three times a day (TID) | ORAL | Status: DC | PRN
  Administered 2015-02-23 – 2015-02-24 (×3): 50 mg via ORAL
  Filled 2015-02-23 (×3): qty 1

## 2015-02-23 NOTE — Care Management Important Message (Signed)
Important Message  Patient Details  Name: Gaye AlkenGene Garciaperez MRN: [id number] Date of Birth: 10/25/1925   Medicare Important Message Given:  Yes-second notification given    Amelda Hapke A, RN 02/23/2015, 10:34 AM

## 2015-02-23 NOTE — Progress Notes (Signed)
[facility] [place] [facility]   PATIENT NAME: Jennifer AlkenGene Bassinger    MR#:  [id number]  DATE OF BIRTH:  09/15/1925  SUBJECTIVE:  CHIEF COMPLAINT:   Chief Complaint  Patient presents with  . Fall  . Hip Pain   - Postoperative day 2 after left hip fracture surgery.  -back pain  REVIEW OF SYSTEMS:  Review of Systems  Constitutional: Negative for fever and chills.  HENT: Negative for ear discharge, ear pain and nosebleeds.   Eyes: Negative for blurred vision and double vision.  Respiratory: Negative for cough, shortness of breath and wheezing.   Cardiovascular: Positive for leg swelling. Negative for chest pain and palpitations.  Gastrointestinal: Negative for nausea, vomiting, abdominal pain, diarrhea and constipation.  Genitourinary: Negative for dysuria.  Musculoskeletal: Positive for myalgias and joint pain.       Left hip pain  Skin: Positive for rash.  Neurological: Negative for dizziness, sensory change, speech change, focal weakness, seizures and headaches.  Psychiatric/Behavioral: Negative for depression.    DRUG ALLERGIES:   Allergies  Allergen Reactions  . Penicillins Rash    VITALS:  Blood pressure 97/50, pulse 77, temperature 98.1 F (36.7 C), temperature source Oral, resp. rate 16, height 5\' 5"  (1.651 m), weight 98.431 kg (217 lb), SpO2 97 %.  PHYSICAL EXAMINATION:  Physical Exam  GENERAL:  81 y.o.-year-old obese patient lying in the bed with no acute distress.  EYES: Pupils equal, round, reactive to light and accommodation. No scleral icterus. Extraocular muscles intact.  HEENT: Head atraumatic, normocephalic. Oropharynx and nasopharynx clear.  NECK:  Supple, no jugular venous distention. No thyroid enlargement, no tenderness.  LUNGS: Normal breath sounds bilaterally, no wheezing, rales,rhonchi or crepitation. No use of accessory muscles of respiration. Decreased bibasilar breath sounds CARDIOVASCULAR: S1, S2 normal. No rubs, or  gallops. 3/6 systolic murmur present ABDOMEN: Soft, nontender, nondistended. Bowel sounds present. No organomegaly or mass.  EXTREMITIES: Left dressing in place with dried blood saturating the dressing, 1+ edema present, right lower leg with 2+ edema, erythema with increased shiny skin from stretching noted. No  cyanosis, or clubbing.  NEUROLOGIC: Cranial nerves II through XII are intact. Muscle strength 5/5 in all extremities except lower extremities due to pain. Sensation intact. Gait not checked.  PSYCHIATRIC: The patient is alert and oriented x 3.  SKIN: No obvious rash, lesion, or ulcer other than the erythema from right leg cellulitis.    LABORATORY PANEL:   CBC  Recent Labs Lab 02/23/15 0350  WBC 8.8  HGB 8.2*  HCT 24.7*  PLT 136*   ------------------------------------------------------------------------------------------------------------------  Chemistries   Recent Labs Lab 02/20/15 1502  02/23/15 0350  NA 141  < > 139  K 4.1  < > 3.5  CL 97*  < > 106  CO2 33*  < > 28  GLUCOSE 147*  < > 104*  BUN 22*  < > 17  CREATININE 1.28*  < > 0.95  CALCIUM 8.9  < > 7.9*  MG 1.9  --   --   AST 23  --   --   ALT 14  --   --   ALKPHOS 197*  --   --   BILITOT 0.6  --   --   < > = values in this interval not displayed. ------------------------------------------------------------------------------------------------------------------  Cardiac Enzymes  Recent Labs Lab 02/20/15 1502  TROPONINI 0.03   ------------------------------------------------------------------------------------------------------------------  RADIOLOGY:  [place] Venous Img Lower Unilateral Right  02/22/2015  CLINICAL DATA:  Swelling  x2 weeks.  History of left DVT. EXAM: RIGHT LOWER EXTREMITY VENOUS DOPPLER ULTRASOUND TECHNIQUE: Gray-scale sonography with compression, as well as color and duplex ultrasound, were performed to evaluate the deep venous system from the level of the common femoral vein through  the popliteal and proximal calf veins. COMPARISON:  None FINDINGS: Normal compressibility of the common femoral, superficial femoral, and popliteal veins, as well as the proximal calf veins. No filling defects to suggest DVT on grayscale or color Doppler imaging. Doppler waveforms show normal direction of venous flow, normal respiratory phasicity and response to augmentation. Survey views of the contralateral common femoral vein are unremarkable. IMPRESSION: 1. No evidence of lower extremity deep vein thrombosis, RIGHT. Electronically Signed   By: Corlis Leak M.D.   On: 02/22/2015 09:41   Dg Femur Port Min 2 Views Left  02/21/2015  CLINICAL DATA:  Left femur fracture EXAM: LEFT FEMUR PORTABLE 2 VIEWS COMPARISON:  02/20/1999 60 FINDINGS: An intra medullary rod and dynamic compression screw now transfix the intertrochanteric proximal left femur fracture. There are 2 distal interlocking screws. Left total knee arthroplasty is anatomically aligned. No breakage or loosening of the hardware. IMPRESSION: ORIF left intertrochanteric femur fracture anatomically aligned. Electronically Signed   By: Jolaine Click M.D.   On: 02/21/2015 14:29    ASSESSMENT AND PLAN:   81 year old female with past medical history significant for arthritis, peripheral vascular disease, depression and anxiety presented to the hospital after a fall and noted to have left hip fracture.  #1 left hip fracture-appreciate orthopedic consult -Postoperative day 2 today. Foley discontinued -Pain management -Postoperative DVT prophylaxis with Lovenox started and physical therapy today  #2 hypotension-postoperatively likely from blood and fluid loss during surgery. -Received fluid bolus, decrease her IV fluids to 75 cc per hour as at high risk for congestive heart failure exacerbation. -Monitor urine output. Encourage her to drink more fluids by mouth -Patient is not on any anti-hypertensives  #3 postoperative acute on chronic  anemia-hemoglobin was 11 prior to surgery and dropped down to 8. -Some oozing noted at the dressing site.  -s/p 1 unit BT  #4 hyperlipidemia-on statin  #5 GERD-on Protonix  #6 DVT prophylaxis-started on Lovenox today.  #7 depression and anxiety continue Lexapro.   All the records are reviewed and case discussed with Care Management/Social Workerr. Management plans discussed with the patient, family and they are in agreement.  CODE STATUS: DNR  TOTAL TIME TAKING CARE OF THIS PATIENT: 35 minutes.   POSSIBLE D/C IN 2-3 DAYS, DEPENDING ON CLINICAL CONDITION.   Keante Urizar M.D on 02/23/2015 at 1:29 PM  Between 7am to 6pm - Pager - [phone]  After 6pm go to [email or web address] - password [email or web address] [facility]  [place] [facility]  Office  [phone]  CC: Primary care physician; Carollee Leitz, NP

## 2015-02-23 NOTE — Progress Notes (Signed)
MD gave order to discontinue telemetry 

## 2015-02-23 NOTE — Progress Notes (Signed)
Patient is able to move in the bed from side to side but was not able to get up with PT.  Back pain worse than the hip surgery.  Continue to monitor low BP

## 2015-02-23 NOTE — Progress Notes (Signed)
Patient ID: Jennifer Olson, female   DOB: 12/24/1925, 81 y.o.   MRN: [id number]  Mental status normal. Sat up at edge of bed, but could not get to chair in PT. Much less bleeding from wound today. 1 unit packed cells last night, hgb 8.2 this am. Continue with present orders.

## 2015-02-23 NOTE — Progress Notes (Signed)
Physical Therapy Treatment Patient Details Name: Jennifer AlkenGene Goodpasture MRN: [id number] DOB: 09/14/1925 Today's Date: 02/23/2015    History of Present Illness Pt here post fall, L hip fracture, ORIF    PT Comments    Pt continues to be very limited with what she can do secondary to severe low back pain.  She reports that pain is chronic, but has never been this bad.  Pt hardly tolerates brief episode of sitting much less trying to stand.  Pt able to show effort with bed exercises, but sitting was very difficult for her.   Follow Up Recommendations  SNF     Equipment Recommendations       Recommendations for Other Services       Precautions / Restrictions Precautions Precautions: Fall Restrictions LLE Weight Bearing: Weight bearing as tolerated    Mobility  Bed Mobility Overal bed mobility: Needs Assistance Bed Mobility: Supine to Sit;Sit to Supine     Supine to sit: Max assist Sit to supine: Max assist   General bed mobility comments: Pt continues to have severe pain during mobility and is unable to partipate fully  Transfers                 General transfer comment: attempted to stand X1, but pt was uanble to even assist enough to get herself off the bed minimally with max assist. Pt refuses trying to get up to recliner.  Ambulation/Gait                 Stairs            Wheelchair Mobility    Modified Rankin (Stroke Patients Only)       Balance                                    Cognition Arousal/Alertness: Awake/alert Behavior During Therapy: WFL for tasks assessed/performed Overall Cognitive Status: Within Functional Limits for tasks assessed                      Exercises General Exercises - Lower Extremity Ankle Circles/Pumps: AROM;Strengthening;15 reps Quad Sets: Strengthening;AROM;10 reps Gluteal Sets: Strengthening;10 reps Short Arc Quad: AROM;10 reps Heel Slides: AAROM;AROM;10 reps Hip  ABduction/ADduction: AROM;10 reps    General Comments        Pertinent Vitals/Pain Pain Score: 10-Worst pain ever Pain Location: severe low back pain    Home Living                      Prior Function            PT Goals (current goals can now be found in the care plan section) Progress towards PT goals: Not progressing toward goals - comment (pt's back pain too limiting at this time to allow much PT)    Frequency  BID    PT Plan Current plan remains appropriate    Co-evaluation             End of Session Equipment Utilized During Treatment: Gait belt Activity Tolerance: Patient limited by pain Patient left: with bed alarm set     Time: 0945-1010 PT Time Calculation (min) (ACUTE ONLY): 25 min  Charges:  $Therapeutic Exercise: 8-22 mins $Therapeutic Activity: 8-22 mins                    G Codes:     Natanya Holecek  Stefan Church, PT, DPT [phone]  Malachi Pro 02/23/2015, 1:46 PM

## 2015-02-24 ENCOUNTER — Encounter
Admission: RE | Admit: 2015-02-24 | Discharge: 2015-02-24 | Disposition: A | Discharge status: Home / Self Care | Payer: Medicare Other | Source: Ambulatory Visit | Attending: Internal Medicine | Admitting: Internal Medicine

## 2015-02-24 ENCOUNTER — Encounter: Payer: Self-pay | Admitting: Orthopedic Surgery

## 2015-02-24 ENCOUNTER — Telehealth: Payer: Self-pay | Admitting: Nurse Practitioner

## 2015-02-24 ENCOUNTER — Inpatient Hospital Stay: Payer: Medicare Other

## 2015-02-24 LAB — TYPE AND SCREEN
ABO/RH(D): O POS
Antibody Screen: NEGATIVE
UNIT DIVISION: 0
Unit division: 0

## 2015-02-24 LAB — CBC
HCT: 24.1 % — ABNORMAL LOW (ref 35.0–47.0)
HEMOGLOBIN: 8.3 g/dL — AB (ref 12.0–16.0)
MCH: 32.4 pg (ref 26.0–34.0)
MCHC: 34.4 g/dL (ref 32.0–36.0)
MCV: 94.1 fL (ref 80.0–100.0)
PLATELETS: 158 10*3/uL (ref 150–440)
RBC: 2.56 MIL/uL — AB (ref 3.80–5.20)
RDW: 15.2 % — ABNORMAL HIGH (ref 11.5–14.5)
WBC: 7.8 10*3/uL (ref 3.6–11.0)

## 2015-02-24 MED ORDER — TRAMADOL HCL 50 MG PO TABS: 50.0000 mg | ORAL_TABLET | Freq: Three times a day (TID) | ORAL | Status: AC | PRN

## 2015-02-24 MED ORDER — CLINDAMYCIN HCL 300 MG PO CAPS: 300.0000 mg | ORAL_CAPSULE | Freq: Three times a day (TID) | ORAL | Status: DC

## 2015-02-24 MED ORDER — ENOXAPARIN SODIUM 40 MG/0.4ML ~~LOC~~ SOLN: 40.0000 mg | SUBCUTANEOUS | Status: DC

## 2015-02-24 MED ORDER — FERROUS SULFATE 325 (65 FE) MG PO TABS: 325.0000 mg | ORAL_TABLET | Freq: Three times a day (TID) | ORAL | Status: AC

## 2015-02-24 MED ORDER — OXYCODONE HCL 5 MG PO TABS: 5.0000 mg | ORAL_TABLET | Freq: Four times a day (QID) | ORAL | Status: DC | PRN

## 2015-02-24 NOTE — Progress Notes (Signed)
Physical Therapy Treatment Patient Details Name: Jennifer AlkenGene Hertzog MRN: [id number] DOB: 03/19/1926 Today's Date: 02/24/2015    History of Present Illness Pt here post fall, L hip fracture, ORIF    PT Comments    Patient continues to be very limited in bed mobility and unable to complete OOB mobility secondary to low back pain. She does provide improved effort in this session with LLE during bed level exercises. She remains unable to tolerate sitting at this time even with all 4 extremities supported secondary to low back pain. Patient would continue to benefit from skilled PT services to address her mobility deficits.   Follow Up Recommendations  SNF     Equipment Recommendations       Recommendations for Other Services       Precautions / Restrictions Precautions Precautions: Fall Restrictions Weight Bearing Restrictions: Yes LLE Weight Bearing: Partial weight bearing    Mobility  Bed Mobility Overal bed mobility: Needs Assistance Bed Mobility: Supine to Sit;Sit to Supine     Supine to sit: Max assist Sit to supine: Max assist;+2 for safety/equipment   General bed mobility comments: Patient is limited primarily by low back pain, provides min-mod assist with trunk mobility and LLE mobility.   Transfers                 General transfer comment: No attempts to stand secondary to low back pain.   Ambulation/Gait                 Stairs            Wheelchair Mobility    Modified Rankin (Stroke Patients Only)       Balance Overall balance assessment: Needs assistance   Sitting balance-Leahy Scale: Fair Sitting balance - Comments: In sitting patient leans posteriorly, complains of low back pain and unable to maintain position.  Postural control: Posterior lean                          Cognition Arousal/Alertness: Awake/alert Behavior During Therapy: WFL for tasks assessed/performed Overall Cognitive Status: Within Functional Limits  for tasks assessed                      Exercises General Exercises - Lower Extremity Ankle Circles/Pumps: AROM;Both;10 reps Short Arc Quad: AROM;10 reps;Both Heel Slides: AAROM;AROM;10 reps;Both Hip ABduction/ADduction: AROM;AAROM;Both Straight Leg Raises: AROM;5 reps;Both (Discontinued as increased low back pain)    General Comments        Pertinent Vitals/Pain Pain Assessment:  (Patient does not rate pain, but asks to be laid flat once in sitting secondary to LBP) Pain Intervention(s): Limited activity within patient's tolerance;Monitored during session;Patient requesting pain meds-RN notified    Home Living                      Prior Function            PT Goals (current goals can now be found in the care plan section) Acute Rehab PT Goals Patient Stated Goal: "Get better" PT Goal Formulation: With patient Time For Goal Achievement: 03/08/15 Potential to Achieve Goals: Fair Progress towards PT goals: Not progressing toward goals - comment (Continues to be limited in mobility secondary to low back pain)    Frequency  BID    PT Plan Current plan remains appropriate    Co-evaluation             End of  Session Equipment Utilized During Treatment: Gait belt Activity Tolerance: Patient limited by pain Patient left: with bed alarm set;in bed;with SCD's reapplied;with call bell/phone within reach     Time: 9147-8295 PT Time Calculation (min) (ACUTE ONLY): 19 min  Charges:  $Therapeutic Exercise: 8-22 mins                    G Codes:      Kerin Ransom, PT, DPT    02/24/2015, 10:44 AM

## 2015-02-24 NOTE — Telephone Encounter (Signed)
Hospital called stating that pt was being discharged this morning for a left hip fracture.[name]. appt made 03/03/15.[name]. Please advise pt

## 2015-02-24 NOTE — Progress Notes (Signed)
Subjective:  I reviewed the patient's lumbar spine films.  She has diffuse spondylosis and degenerative scoliosis.  She also has multiple levels with compression fractures.  It is difficult to determine their age on plain film although the radiologist states they appear chronic.  I have shared these results with the patient's daughter.  Patient is having no neurologic symptoms and therefore I am recommending use of a lumbosacral corset and discharge to rehab.  I will re-evaluate the patient in the office in 10 days.  If she is still having significant pain at that time an MRI would be considered to evaluate the acuity of these compression fractures and determine if kyphoplasty would be an option for her.   Objective:   VITALS:   Filed Vitals:   02/24/15 0350 02/24/15 0821 02/24/15 0827 02/24/15 1039  BP: 131/63 112/44 113/47   Pulse: 83 77  80  Temp: 98.3 F (36.8 C) 98.8 F (37.1 C)    TempSrc: Oral Oral    Resp: 18     Height:      Weight:      SpO2: 98% 97%  93%    PHYSICAL EXAM:    LABS  Results for orders placed or performed during the hospital encounter of 02/20/15 (from the past 24 hour(s))  CBC     Status: Abnormal   Collection Time: 02/24/15  3:57 AM  Result Value Ref Range   WBC 7.8 3.6 - 11.0 K/uL   RBC 2.56 (L) 3.80 - 5.20 MIL/uL   Hemoglobin 8.3 (L) 12.0 - 16.0 g/dL   HCT 40.924.1 (L) 81.135.0 - 91.447.0 %   MCV 94.1 80.0 - 100.0 fL   MCH 32.4 26.0 - 34.0 pg   MCHC 34.4 32.0 - 36.0 g/dL   RDW 78.215.2 (H) 95.611.5 - 21.314.5 %   Platelets 158 150 - 440 K/uL    Dg Lumbar Spine 2-3 Views  02/24/2015  CLINICAL DATA:  History of lumbar compression fractures. Recent left hip surgery. Back pain. EXAM: LUMBAR SPINE - 2-3 VIEW COMPARISON:  None. FINDINGS: This report assumes 5 non rib-bearing lumbar vertebrae. There is moderate levo curvature of the lumbar spine. There are compression fractures of indeterminate chronicity at each lumbar level, moderate at L1 with approximately 50% loss of  vertebral body height, mild-to-moderate at L2 with approximately 30-40% loss of vertebral body height, moderate at L3 with approximately 50-60% loss of vertebral body height, moderate at L4 with approximately 50% loss of vertebral body height and mild at L5 with approximately 30% loss of vertebral body height. There is marked degenerative disc disease throughout the lumbar spine, most prominent at L3-4. There is minimal 3 mm retrolisthesis at L2-3. There is minimal 3 mm anterolisthesis at L3-4. There is 6 mm anterolisthesis at L4-5. There is facet arthropathy bilaterally throughout the lumbar spine. Aorto bi-iliac stent graft is noted with embolization coils in the region of the left internal iliac artery. Partially visualized is surgical hardware in the bilateral proximal femora. Diffuse osteopenia. IMPRESSION: 1. Diffuse osteopenia. Compression fractures at each lumbar level as detailed above, of indeterminate chronicity, likely chronic. 2. Moderate levo curvature of the lumbar spine. Marked degenerative disc disease and facet arthropathy throughout the lumbar spine, with mild multilevel lumbar spondylolisthesis, likely degenerative. Electronically Signed   By: Delbert PhenixJason A Poff M.D.   On: 02/24/2015 11:31    Assessment/Plan: 3 Days Post-Op   Active Problems:   Hip fracture (HCC)      Jennifer FairlyKRASINSKI, Dorea Duff , MD 02/24/2015, 11:51  AM

## 2015-02-24 NOTE — Addendum Note (Signed)
Addendum  created 02/24/15 1349 by Junious SilkMark Amro Winebarger, CRNA   Modules edited: Anesthesia Medication Administration

## 2015-02-24 NOTE — Discharge Instructions (Signed)
Orthopaedic Surgery Discharge instructions  Patient should continue to receive physical therapy for hip and lower extremity range of motion, strengthening and gait training. She should elevate the left lower extremity whenever possible. She may apply ice to the surgical site to help reduce swelling. Dressing should be changed when necessary basis if drainage is observed. Continue DVT prophylaxis for 4-6 weeks postop. Patient will follow with orthopedic surgeon in his office in 10-14 days. Staples will be removed in the orthopedic office.  [facility], Dr. Martha ClanKrasinski, [phone]

## 2015-02-24 NOTE — Progress Notes (Signed)
Attempted to call report again, still no answer. Will try again in a bit.

## 2015-02-24 NOTE — Progress Notes (Signed)
[facility] to [place] [facility]. Waiting on results from DG spine, then will call ems for transport.

## 2015-02-24 NOTE — Progress Notes (Signed)
Patient is medically stable for D/C to [facility] today. Per Kim admissions coordinator at [facility] is going to room 203-A. RN will call report at [phone] and arrange EMS for transport. Clinical Child psychotherapistocial Worker (CSW) sent D/C Summary and D/C packet to [employer] via [employer]. Packet on chart included DNR, prescriptions and EMS form. Patient is aware of above. Patient's daughter [name] was at bedside and aware of above. Daughter asked how to apply for long term care Medicaid. CSW gave daughter Craig StaggersRose Davis's phone number (Mediciad worker). Per daughter patient [place] long [place] in [place]. Please reconsult if future social work needs arise. CSW signing off.   Jetta LoutBailey Morgan, LCSWA [phone]

## 2015-02-24 NOTE — Progress Notes (Deleted)
Patient requesting more pain medication but is not due at the time. Dr Joice LoftsPoggi paged for any additional interventions that can be done. Waiting on call back.

## 2015-02-24 NOTE — Progress Notes (Signed)
Plan is for [facility] to [facility] today. Daughter at bedside, wants Dr Martha ClanKrasinski to call her when he rounds on patient. Dressing on left hip intact with minimal amount of drainage. O2 at 3L. Tylenol given for pain with relief. Resting in bed. Continue to monitor.

## 2015-02-24 NOTE — Clinical Social Work Placement (Signed)
   CLINICAL SOCIAL WORK PLACEMENT  NOTE  Date:  02/24/2015  Patient Details  Name: Jennifer AlkenGene Ellingsen MRN: [id number] Date of Birth: 08/18/1925  Clinical Social Work is seeking post-discharge placement for this patient at the Skilled  Nursing Facility level of care (*CSW will initial, date and re-position this form in  chart as items are completed):  Yes   Patient/family provided with [place] Clinical Social Work Department's list of facilities offering this level of care within the geographic area requested by the patient (or if unable, by the patient's family).  Yes   Patient/family informed of their freedom to choose among providers that offer the needed level of care, that participate in Medicare, Medicaid or managed care program needed by the patient, have an available bed and are willing to accept the patient.  Yes   Patient/family informed of [facility]'s ownership interest in [facility] and [facility], as well as of the fact that they are under no obligation to receive care at these facilities.  PASRR submitted to EDS on 02/21/15     PASRR number received on 02/21/15     Existing PASRR number confirmed on       FL2 transmitted to all facilities in geographic area requested by pt/family on 02/21/15     FL2 transmitted to all facilities within larger geographic area on       Patient informed that his/her managed care company has contracts with or will negotiate with certain facilities, including the following:        Yes   Patient/family informed of bed offers received.  Patient chooses bed at  [facility] )     Physician recommends and patient chooses bed at      Patient to be transferred to  [facility] ) on 02/24/15.  Patient to be transferred to facility by  [facility] )     Patient family notified on 02/24/15 of transfer.  Name of family member notified:   (Patient's daughter [name] was at bedside and aware of D/C today. )     PHYSICIAN       Additional Comment:    _______________________________________________ Haig ProphetMorgan, Jesslynn Kruck G, LCSW 02/24/2015, 10:35 AM

## 2015-02-24 NOTE — Progress Notes (Signed)
  Subjective:  Postop Day #3 status post intramedullary fixation for left intertrochanteric hip fracture. Patient reports left hip pain as mild.  She is complaining more of low back pain. Patient has a history of chronic back pain with degenerative scoliosis.  Objective:   VITALS:   Filed Vitals:   02/23/15 2005 02/24/15 0350 02/24/15 0821 02/24/15 0827  BP: 96/50 131/63 112/44 113/47  Pulse: 83 83 77   Temp: 99 F (37.2 C) 98.3 F (36.8 C) 98.8 F (37.1 C)   TempSrc: Axillary Oral Oral   Resp: 18 18    Height:      Weight:      SpO2: 97% 98% 97%     PHYSICAL EXAM:  Left lower extremity: Patient's incisions are clean dry and intact. There is modest serosanguineous drainage over the proximal bandage. Her thigh compartments are soft and compressible. She has no erythema. She has mild swelling but her thigh compartments are soft and compressible. Patient has intact sensation light touch in both feet. She can flex and extend all toes on both feet and dorsiflex and plantarflex her ankles and both lower extremities as well. She has palpable pedal pulses.  She is lying in bed with the head elevated approximately 30. Patient appears uncomfortable but she states this is due to her back pain hip.   LABS  Results for orders placed or performed during the hospital encounter of 02/20/15 (from the past 24 hour(s))  CBC     Status: Abnormal   Collection Time: 02/24/15  3:57 AM  Result Value Ref Range   WBC 7.8 3.6 - 11.0 K/uL   RBC 2.56 (L) 3.80 - 5.20 MIL/uL   Hemoglobin 8.3 (L) 12.0 - 16.0 g/dL   HCT 16.124.1 (L) 09.635.0 - 04.547.0 %   MCV 94.1 80.0 - 100.0 fL   MCH 32.4 26.0 - 34.0 pg   MCHC 34.4 32.0 - 36.0 g/dL   RDW 40.915.2 (H) 81.111.5 - 91.414.5 %   Platelets 158 150 - 440 K/uL    No results found.  Assessment/Plan: 3 Days Post-Op   Active Problems:   Hip fracture [facility](HCC)  Patient has had persistent low back pain since admission to the hospital. I'm ordering lumbar spine films to evaluate for  possible compression fracture. I'm also ordering a lumbosacral corset. Patient may have had exacerbation of her underlying chronic low back pain from her recent fall but I want to be sure that she does not have a new compression fracture before she is discharged from the hospital. If the x-rays are negative she will go to rehabilitation today in follow-up in my office in 10 days. She'll continue physical therapy at rehabilitation for left hip range of motion, lower extremity strengthening and gait training. She will use a walker for assistance of a relation remain partial weightbearing on the left hip until follow-up. She should have dressing changes when necessary. Patient may continue to apply ice to the surgical site. I recommend she use a lumbosacral corset when she is up out of bed. Continue current pain management. Patient will remain on Lovenox 40 mg subcutaneous daily for DVT prophylaxis while at rehabilitation. I spoke with the patient's daughter by phone who understands and agrees with this plan. The patient also understands and agrees to plan.    Juanell FairlyKRASINSKI, Lynell Greenhouse , MD 02/24/2015, 10:34 AM

## 2015-02-24 NOTE — Telephone Encounter (Signed)
Thank you. Will follow up with patient. 

## 2015-02-24 NOTE — Progress Notes (Signed)
Patient is being discharged [facility]. Trying to call report to facility but no one is answering. Will attempt to call again in a few minutes.

## 2015-02-24 NOTE — Discharge Summary (Signed)
[facility] [place] at [facility]   PATIENT NAME: Jennifer Olson    MR#:  [id number]  DATE OF BIRTH:  Apr 15, 1926  DATE OF ADMISSION:  02/20/2015 ADMITTING PHYSICIAN: Delfino Lovett, MD  DATE OF DISCHARGE: 02/24/15  PRIMARY CARE PHYSICIAN: Carollee Leitz, NP    ADMISSION DIAGNOSIS:  Closed left hip fracture, initial encounter (HCC) [S72.002A] Cellulitis of right lower extremity without foot [L03.115]  DISCHARGE DIAGNOSIS:  Left hip fracture  SECONDARY DIAGNOSIS:   Past Medical History  Diagnosis Date  . Arthritis   . Depression   . History of fainting spells of unknown cause   . Migraines   . Blood transfusion without reported diagnosis   . UTI (urinary tract infection)   . Urinary incontinence   . Aneurysm of abdominal aorta (HCC)   . 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  #1 left hip fracture-appreciate orthopedic consult -Postoperative day 3 today.  -Pain management -Postoperative DVT prophylaxis with Lovenox  and cont physical therapy at rehab -op site mild oozing. Po clinda for total 7 days #2 hypotension-postoperatively likely from blood and fluid loss during surgery. -improved -Patient is not on any anti-hypertensives  #3  postoperative acute on chronic anemia-hemoglobin was 11 prior to surgery and dropped down to 8. -Some oozing noted at the dressing site.  -s/p 1 unit BT -hgb now stable at 8.3  #4 hyperlipidemia-on statin  #5 GERD-on Protonix  #6 DVT prophylaxis-on Lovenox .  #7 depression and anxiety continue Lexapro. D/c to rehab if ok with ortho CONSULTS OBTAINED:   Juanell Fairly, MD DRUG ALLERGIES:   Allergies  Allergen Reactions  . Penicillins Rash    DISCHARGE MEDICATIONS:   Current Discharge  Medication List    START taking these medications   Details  clindamycin (CLEOCIN) 300 MG capsule Take 1 capsule (300 mg total) by mouth every 8 (eight) hours. Qty: 9 capsule, Refills: 0    enoxaparin (LOVENOX) 40 MG/0.4ML injection Inject 0.4 mLs (40 mg total) into the skin daily. Qty: 14 Syringe, Refills: 0    ferrous sulfate 325 (65 FE) MG tablet Take 1 tablet (325 mg total) by mouth 3 (three) times daily after meals. Qty: 90 tablet, Refills: 3    traMADol (ULTRAM) 50 MG tablet Take 1 tablet (50 mg total) by mouth every 8 (eight) hours as needed for moderate pain. Qty: 30 tablet, Refills: 0      CONTINUE these medications which have NOT CHANGED   Details  busPIRone (BUSPAR) 5 MG tablet Take 1 tablet (5 mg total) by mouth 2 (two) times daily. Qty: 60 tablet, Refills: 2    ergocalciferol (VITAMIN D2) 50000 UNITS capsule Take 1 capsule (50,000 Units total) by mouth once a week. Qty: 12 capsule, Refills: 0    escitalopram (LEXAPRO) 10 MG tablet Take 1 tablet (10 mg total) by mouth daily. Qty: 30 tablet, Refills: 2    fluticasone (FLONASE) 50 MCG/ACT nasal spray Place 2 sprays into both nostrils daily as needed for allergies or rhinitis.     furosemide (LASIX) 40 MG tablet Take 1 tablet (40 mg total) by mouth daily. Qty: 30 tablet, Refills: 2    meloxicam (MOBIC) 15 MG tablet Take 1 tablet (15 mg total) by mouth daily. Qty: 30 tablet, Refills: 2  oxyCODONE (OXY IR/ROXICODONE) 5 MG immediate release tablet Take 1 tablet (5 mg total) by mouth every 6 (six) hours as needed for severe pain. Qty: 120 tablet, Refills: 0    pantoprazole (PROTONIX) 20 MG tablet Take 1 tablet (20 mg total) by mouth daily. Qty: 30 tablet, Refills: 2    Potassium Chloride ER 20 MEQ TBCR Take 40 mEq by mouth daily with breakfast. Qty: 60 tablet, Refills: 2    predniSONE (DELTASONE) 5 MG tablet Take 1 tablet (5 mg total) by mouth daily with breakfast. Qty: 30 tablet, Refills: 2    rOPINIRole  (REQUIP) 2 MG tablet Take 1 tablet (2 mg total) by mouth 3 (three) times daily. Qty: 90 tablet, Refills: 2    simvastatin (ZOCOR) 20 MG tablet Take 1 tablet (20 mg total) by mouth at bedtime. Qty: 30 tablet, Refills: 2    solifenacin (VESICARE) 10 MG tablet Take 1 tablet (10 mg total) by mouth daily. Qty: 30 tablet, Refills: 2    tamsulosin (FLOMAX) 0.4 MG CAPS capsule Take 1 capsule (0.4 mg total) by mouth at bedtime. Qty: 30 capsule, Refills: 2    traZODone (DESYREL) 50 MG tablet Take 1 tablet (50 mg total) by mouth at bedtime. Qty: 30 tablet, Refills: 2    azithromycin (ZITHROMAX) 250 MG tablet Take 2 tablets by mouth on day 1, take 1 tablet by mouth each day after for 4 days. Qty: 6 each, Refills: 0    bacitracin ointment Apply to affected area two times per day for 10 days. Qty: 30 g, Refills: 0        If you experience worsening of your admission symptoms, develop shortness of breath, life threatening emergency, suicidal or homicidal thoughts you must seek medical attention immediately by calling 911 or calling your MD immediately  if symptoms less severe.  You Must read complete instructions/literature along with all the possible adverse reactions/side effects for all the Medicines you take and that have been prescribed to you. Take any new Medicines after you have completely understood and accept all the possible adverse reactions/side effects.   Please note  You were cared for by a hospitalist during your hospital stay. If you have any questions about your discharge medications or the care you received while you were in the hospital after you are discharged, you can call the unit and asked to speak with the hospitalist on call if the hospitalist that took care of you is not available. Once you are discharged, your primary care physician will handle any further medical issues. Please note that NO REFILLS for any discharge medications will be authorized once you are discharged,  as it is imperative that you return to your primary care physician (or establish a relationship with a primary care physician if you do not have one) for your aftercare needs so that they can reassess your need for medications and monitor your lab values. Today   SUBJECTIVE   Weak.  VITAL SIGNS:  Blood pressure 131/63, pulse 83, temperature 98.3 F (36.8 C), temperature source Oral, resp. rate 18, height  (1.651 m), weight 98.431 kg (217 lb), SpO2 98 %.  I/O:   Intake/Output Summary (Last 24 hours) at 02/24/15 0751 Last data filed at 02/24/15 0600  Gross per 24 hour  Intake      0 ml  Output   1025 ml  Net  -1025 ml    PHYSICAL EXAMINATION:  GENERAL:  81 y.o.-year-old patient lying in the bed with no acute  distress. obese EYES: Pupils equal, round, reactive to light and accommodation. No scleral icterus. Extraocular muscles intact.  HEENT: Head atraumatic, normocephalic. Oropharynx and nasopharynx clear.  NECK:  Supple, no jugular venous distention. No thyroid enlargement, no tenderness.  LUNGS: Normal breath sounds bilaterally, no wheezing, rales,rhonchi or crepitation. No use of accessory muscles of respiration.  CARDIOVASCULAR: S1, S2 normal. No murmurs, rubs, or gallops.  ABDOMEN: Soft, non-tender, non-distended. Bowel sounds present. No organomegaly or mass.  EXTREMITIES: No pedal edema, cyanosis, or clubbing. Left hip dressing +. Mild discharge NEUROLOGIC: Cranial nerves II through XII are intact. Muscle strength 5/5 in all extremities. Sensation intact. Gait not checked.  PSYCHIATRIC: The patient is alert and oriented x 3.  SKIN: No obvious rash, lesion, or ulcer.   DATA REVIEW:   CBC   Recent Labs Lab 02/24/15 0357  WBC 7.8  HGB 8.3*  HCT 24.1*  PLT 158    Chemistries   Recent Labs Lab 02/20/15 1502  02/23/15 0350  NA 141  < > 139  K 4.1  < > 3.5  CL 97*  < > 106  CO2 33*  < > 28  GLUCOSE 147*  < > 104*  BUN 22*  < > 17  CREATININE 1.28*  < >  0.95  CALCIUM 8.9  < > 7.9*  MG 1.9  --   --   AST 23  --   --   ALT 14  --   --   ALKPHOS 197*  --   --   BILITOT 0.6  --   --   < > = values in this interval not displayed.  Microbiology Results   Recent Results (from the past 240 hour(s))  Blood culture (routine x 2)     Status: None (Preliminary result)   Collection Time: 02/20/15  3:02 PM  Result Value Ref Range Status   Specimen Description BLOOD RIGHT ARM  Final   Special Requests BOTTLES DRAWN AEROBIC AND ANAEROBIC  5CC  Final   Culture NO GROWTH 3 DAYS  Final   Report Status PENDING  Incomplete  Urine culture     Status: None   Collection Time: 02/20/15  3:02 PM  Result Value Ref Range Status   Specimen Description URINE, RANDOM  Final   Special Requests NONE  Final   Culture NO GROWTH 2 DAYS  Final   Report Status 02/22/2015 FINAL  Final  Blood culture (routine x 2)     Status: None (Preliminary result)   Collection Time: 02/20/15  3:05 PM  Result Value Ref Range Status   Specimen Description BLOOD RIGHT AC  Final   Special Requests   Final    BOTTLES DRAWN AEROBIC AND ANAEROBIC  AER 6CC AWNA 4CC   Culture NO GROWTH 3 DAYS  Final   Report Status PENDING  Incomplete  MRSA PCR Screening     Status: None   Collection Time: 02/20/15  6:15 PM  Result Value Ref Range Status   MRSA by PCR NEGATIVE NEGATIVE Final    Comment:        The GeneXpert MRSA Assay (FDA approved for NASAL specimens only), is one component of a comprehensive MRSA colonization surveillance program. It is not intended to diagnose MRSA infection nor to guide or monitor treatment for MRSA infections.     RADIOLOGY:  US Venous Img Lower Unilateral Right  02/22/2015  CLINICAL DATA:  Swelling x2 weeks.  History of left DVT. EXAM: RIGHT LOWER EXTREMITY VENOUS DOPPLER  ULTRASOUND TECHNIQUE: Gray-scale sonography with compression, as well as color and duplex ultrasound, were performed to evaluate the deep venous system from the level of the common  femoral vein through the popliteal and proximal calf veins. COMPARISON:  None FINDINGS: Normal compressibility of the common femoral, superficial femoral, and popliteal veins, as well as the proximal calf veins. No filling defects to suggest DVT on grayscale or color Doppler imaging. Doppler waveforms show normal direction of venous flow, normal respiratory phasicity and response to augmentation. Survey views of the contralateral common femoral vein are unremarkable. IMPRESSION: 1. No evidence of lower extremity deep vein thrombosis, RIGHT. Electronically Signed   By: Corlis Leak  Hassell M.D.   On: 02/22/2015 09:41     Management plans discussed with the patient, family and they are in agreement.  CODE STATUS:     Code Status Orders        Start     Ordered   02/21/15 1700  Do not attempt resuscitation (DNR)   Continuous    Question Answer Comment  In the event of cardiac or respiratory ARREST Do not call a "code blue"   In the event of cardiac or respiratory ARREST Do not perform Intubation, CPR, defibrillation or ACLS   In the event of cardiac or respiratory ARREST Use medication by any route, position, wound care, and other measures to relive pain and suffering. May use oxygen, suction and manual treatment of airway obstruction as needed for comfort.      02/21/15 1659    Advance Directive Documentation        Most Recent Value   Type of Advance Directive  Healthcare Power of Attorney, Living will   Pre-existing out of facility DNR order (yellow form or pink MOST form)     "MOST" Form in Place?        TOTAL TIME TAKING CARE OF THIS PATIENT: 40 minutes.    Gardy Montanari M.D on 02/24/2015 at 7:51 AM  Between 7am to 6pm - Pager - [phone] After 6pm go to [email or web address] - password [email or web address] [facility]  [facility]  Office  [phone]  CC: Primary care physician; Carollee Leitzoss, Carrie M, NP

## 2015-02-25 ENCOUNTER — Telehealth: Payer: Self-pay

## 2015-02-25 ENCOUNTER — Encounter
Admission: RE | Admit: 2015-02-25 | Discharge: 2015-02-25 | Disposition: A | Discharge status: Home / Self Care | Payer: Medicare Other | Source: Ambulatory Visit | Attending: Internal Medicine | Admitting: Internal Medicine

## 2015-02-25 LAB — CULTURE, BLOOD (ROUTINE X 2)
CULTURE: NO GROWTH
Culture: NO GROWTH

## 2015-02-25 NOTE — Telephone Encounter (Signed)
Pt daughter called stating that Ms. Jennifer Olson will now be going to rehab and then to a nursing home..please advise pt daughter at [phone] if you have any further questions.

## 2015-02-25 NOTE — Telephone Encounter (Signed)
Spoke with daughter Cardell Peach(Gay).  She reports patient has gone to [facility] and will not be returning home.  Other provisions will be made for placement at a nursing home.  Upcoming appointment cancelled per request.  [name] will call back and update new living arrangements and make another appointment at that time, if needed.

## 2015-02-25 NOTE — Telephone Encounter (Signed)
Thank you.  Spoke with patient's daughter Cardell Peach(Gay). Upcoming appointment cancelled.

## 2015-02-25 NOTE — Telephone Encounter (Signed)
Unable to reach patient.  Left message to return my call.  Will continue to follow.  First TCM call attempt.

## 2015-02-25 NOTE — Telephone Encounter (Signed)
Not a problem. Thank you.

## 2015-02-26 LAB — ABO/RH: ABO/RH(D): O POS

## 2015-02-27 LAB — GLUCOSE, CAPILLARY: GLUCOSE-CAPILLARY: 161 mg/dL — AB (ref 65–99)

## 2015-03-03 ENCOUNTER — Ambulatory Visit: Payer: Medicare Other | Admitting: Nurse Practitioner

## 2015-03-04 ENCOUNTER — Encounter: Payer: Self-pay | Admitting: *Deleted

## 2015-10-13 ENCOUNTER — Ambulatory Visit (INDEPENDENT_AMBULATORY_CARE_PROVIDER_SITE_OTHER): Payer: Medicare Other | Admitting: Neurology

## 2015-10-13 ENCOUNTER — Encounter: Payer: Self-pay | Admitting: Neurology

## 2015-10-13 VITALS — BP 120/60 | HR 60

## 2015-10-13 DIAGNOSIS — G214 Vascular parkinsonism: Secondary | ICD-10-CM | POA: Diagnosis not present

## 2015-10-13 MED ORDER — CARBIDOPA-LEVODOPA 25-100 MG PO TABS: 1.0000 | ORAL_TABLET | Freq: Three times a day (TID) | ORAL | Status: AC

## 2015-10-13 NOTE — Progress Notes (Signed)
Jennifer Olson was seen today in the movement disorders clinic for neurologic consultation at the request of Dr. Malvin JohnsPotter.  His primary care provider is Doss, Oleh Geninarrie M, NP.  The consultation is for the evaluation of tremor.  She is accompanied by her daughter and her grandson who supplement the history.   I have reviewed prior records made available to me.  The patient first saw Dr. Malvin JohnsPotter on 05/01/2015 with complaints of tremor for about 1 year and maybe even worse, right worse than left hand.  She was diagnosed with essential tremor.  She had already been on Requip 2 mg when the patient first saw Dr. Malvin JohnsPotter ([place] in [place]) and he discontinued the Requip at his first visit and started on gabapentin, 100 mg 3 times per day.  She followed up with Dr. Malvin JohnsPotter on 05/20/2015 and her daughter had reported that she was more confused after she had started the gabapentin, so Dr. Malvin JohnsPotter discontinued this medication.  When she was seen back in March, 2017 she was started on carbidopa/levodopa 25/100 twice per day.  She followed up in early May and when she returned, she was on both carbidopa/levodopa 25/100 twice per day as well as back on Requip 2 mg 3 times per day.  The patient continued to have tremor, so she was referred here for a second opinion. She is off of requip and levodopa today.    When tremor first started, it started in the right hand, like "she was playing a guitar."  It was at rest and not with action.  Daughter does state that since d/c the above medication, she is "trembling everywhere."    Specific Symptoms:  Tremor: Yes.     Affected by caffeine:  No. (but does drink 2 glasses coffee in AM)  Affected by alcohol:  No.  Affected by stress:  No.  Affected by fatigue:  No.  Spills soup if on spoon:  Yes.  , but does hold something underneath her   Family hx of similar:  No. Voice: become quiet per family Sleep: sleeps well  Vivid Dreams:  No.  Acting out dreams:  No. Wet  Pillows: No. Postural symptoms:  Yes.   (poor balance for 10 years)  Falls?  Yes.   (fell in Oct 2016 and broke hip); [facility] but sometimes will use WC Bradykinesia symptoms: shuffling gait, slow movements and difficulty getting out of a chair Loss of smell:  Yes.   Loss of taste:  No. Urinary Incontinence:  Yes.   (wears undergarments; urinary urgency) Difficulty Swallowing:  No. Handwriting, micrographia: Yes.   Trouble with ADL's:  Yes.   (gets help at SNF)  Trouble buttoning clothing: Yes.   Depression:  No. Memory changes:  Yes.   (short and long term; at SNF since November 2016) Hallucinations:  No.  visual distortions: No. N/V:  No. Lightheaded:  Yes.    Syncope: No. Diplopia:  Yes.  , intermittent, momentary   Neuroimaging has not previously been performed.  Denies known exposure to reglan/metoclopramide or known exposure to antipsychotics.  Daughter does state that she had exposure to antidepressants and is on lexapro now.     ALLERGIES:   Allergies  Allergen Reactions  . Penicillins Rash    CURRENT MEDICATIONS:  Outpatient Encounter Prescriptions as of 10/13/2015  Medication Sig  . azithromycin (ZITHROMAX) 250 MG tablet Take 2 tablets by mouth on day 1, take 1 tablet by mouth each  day after for 4 days. (Patient not taking: Reported on 02/20/2015)  . bacitracin ointment Apply to affected area two times per day for 10 days. (Patient not taking: Reported on 02/20/2015)  . busPIRone (BUSPAR) 5 MG tablet Take 1 tablet (5 mg total) by mouth 2 (two) times daily.  . clindamycin (CLEOCIN) 300 MG capsule Take 1 capsule (300 mg total) by mouth every 8 (eight) hours.  . enoxaparin (LOVENOX) 40 MG/0.4ML injection Inject 0.4 mLs (40 mg total) into the skin daily.  . ergocalciferol (VITAMIN D2) 50000 UNITS capsule Take 1 capsule (50,000 Units total) by mouth once a week. (Patient taking differently: Take 50,000 Units by mouth once a week. On Monday)  . escitalopram  (LEXAPRO) 10 MG tablet Take 1 tablet (10 mg total) by mouth daily.  . ferrous sulfate 325 (65 FE) MG tablet Take 1 tablet (325 mg total) by mouth 3 (three) times daily after meals.  . fluticasone (FLONASE) 50 MCG/ACT nasal spray Place 2 sprays into both nostrils daily as needed for allergies or rhinitis.   . furosemide (LASIX) 40 MG tablet Take 1 tablet (40 mg total) by mouth daily.  . meloxicam (MOBIC) 15 MG tablet Take 1 tablet (15 mg total) by mouth daily.  [name] oxyCODONE (OXY IR/ROXICODONE) 5 MG immediate release tablet Take 1 tablet (5 mg total) by mouth every 6 (six) hours as needed for severe pain. (Patient taking differently: Take 10 mg by mouth 2 (two) times daily. )  . oxyCODONE (OXY IR/ROXICODONE) 5 MG immediate release tablet Take 1 tablet (5 mg total) by mouth every 6 (six) hours as needed for breakthrough pain ((for MODERATE breakthrough pain)).  . pantoprazole (PROTONIX) 20 MG tablet Take 1 tablet (20 mg total) by mouth daily.  . Potassium Chloride ER 20 MEQ TBCR Take 40 mEq by mouth daily with breakfast.  . predniSONE (DELTASONE) 5 MG tablet Take 1 tablet (5 mg total) by mouth daily with breakfast.  . rOPINIRole (REQUIP) 2 MG tablet Take 1 tablet (2 mg total) by mouth 3 (three) times daily.  . simvastatin (ZOCOR) 20 MG tablet Take 1 tablet (20 mg total) by mouth at bedtime.  . solifenacin (VESICARE) 10 MG tablet Take 1 tablet (10 mg total) by mouth daily.  . tamsulosin (FLOMAX) 0.4 MG CAPS capsule Take 1 capsule (0.4 mg total) by mouth at bedtime.  . traMADol (ULTRAM) 50 MG tablet Take 1 tablet (50 mg total) by mouth every 8 (eight) hours as needed for moderate pain. (Patient taking differently: Take 25 mg by mouth every 8 (eight) hours as needed for moderate pain. )  . traZODone (DESYREL) 50 MG tablet Take 1 tablet (50 mg total) by mouth at bedtime.   No facility-administered encounter medications on file as of 10/13/2015.    PAST MEDICAL HISTORY:   Past Medical History  Diagnosis  Date  . Arthritis   . Depression   . History of fainting spells of unknown cause   . Migraines   . Blood transfusion without reported diagnosis   . UTI (urinary tract infection)   . Urinary incontinence   . Aneurysm of abdominal aorta (HCC)   . Aneurysm artery, iliac (HCC)     PAST SURGICAL HISTORY:   Past Surgical History  Procedure Laterality Date  . Breast surgery      Biopsy  . Appendectomy  1980  . Tonsillectomy and adenoidectomy  1934  . Cesarean section      1956/1962  . Foot surgery  1976  Both Feet  . Knee surgery      Both Knees/1999/2000  . Broken hip  2015    Right Hip  . Femur im nail Left 02/21/2015    Procedure: INTRAMEDULLARY (IM) NAIL FEMORAL;  Surgeon: Juanell Fairly, MD;  Location: [facility] ORS;  Service: Orthopedics;  Laterality: Left;    SOCIAL HISTORY:   Social History   Social History  . Marital Status: Widowed    Spouse Name: N/A  . Number of Children: N/A  . Years of Education: N/A   Occupational History  . Not on file.   Social History Main Topics  . Smoking status: Never Smoker   . Smokeless tobacco: Not on file  . Alcohol Use: 0.0 oz/week    0 Standard drinks or equivalent per week     Comment: wine occasionally  . Drug Use: No  . Sexual Activity: No   Other Topics Concern  . Not on file   Social History Narrative   Patient has recently moved with family from [place] to [place]. Patient is currently living with daughter, son-in-law, and granddaughter.   Patient is retired and widowed.   Patient reports drinking 1 cup of coffee in the morning       FAMILY HISTORY:   Family Status  Relation Status Death Age  . Mother Deceased     arthritis, CHF  . Father Deceased     aneurysms  . Brother Deceased     heart disease  . Daughter Alive     arthritis  . Daughter Alive     healthy    ROS:  A complete 10 system review of systems was obtained and was unremarkable apart from what is mentioned above.  PHYSICAL  EXAMINATION:    VITALS:   Filed Vitals:   10/13/15 1402  BP: 120/60  Pulse: 60  SpO2: 94%    GEN:  The patient appears stated age and is in NAD. HEENT:  Normocephalic, atraumatic.  The mucous membranes are moist. The superficial temporal arteries are without ropiness or tenderness. CV:  RRR with 3/6 SEM Lungs:  CTAB.  Wearing O2 Neck/HEME:  There are no carotid bruits bilaterally. MS:  Significant swelling of the LE  Neurological examination:  Orientation: The patient is alert and oriented x3. Fund of knowledge is appropriate.  Recent and remote memory are intact.  Attention and concentration are normal.    Able to name objects and repeat phrases. Cranial nerves: There is good facial symmetry. Pupils are equal round and reactive to light bilaterally. Fundoscopic exam reveals clear margins bilaterally. Extraocular muscles are intact. The visual fields are full to confrontational testing. The speech is fluent and clear but she is hypophonic. Soft palate rises symmetrically and there is no tongue deviation. Hearing is intact to conversational tone. Sensation: Sensation is intact to light and pinprick throughout (facial, trunk, extremities). Vibration is decreased at the bilateral big toe. There is no extinction with double simultaneous stimulation. There is no sensory dermatomal level identified. Motor: Strength is 5/5 in the bilateral upper and lower extremities with the exception of shoulder abduction.   Shoulder shrug is equal and symmetric.  There is no pronator drift. Deep tendon reflexes: Deep tendon reflexes are 1/4 at the bilateral biceps, triceps, brachioradialis, 2/4 at the bilateral patella and trace at the achilles. Plantar responses are downgoing bilaterally.  Movement examination: Tone: There is normal tone in the bilateral upper extremities.  The tone in the lower extremities is normal.  Abnormal movements:  There is a R more than LUE resting tremor that does not increase with  distraction procedures.  She has occasional dyskinetic movements in the legs.  At times, she will have intermittent "shuddering" of the entire body but it is fleeing an momentary.  No postural or intention tremor noted. Coordination:  There is no decremation with RAM's, with any form of RAMS, including alternating supination and pronation of the forearm, hand opening and closing, finger taps, heel taps and toe taps. Gait and Station: The patient has difficulty arising out of a deep-seated chair without the use of the hands and requires significant help from the examiner.  She was given a walker.  She puts a significant amount of weight on that walker and she stoops over.  She is unstable, but reports that she cannot walk without the walker to test true shuffling.   ASSESSMENT/PLAN:  1.  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  She has a rest tremor and no significant postural or intention tremor.  Because she has intermittent dyskinetic/choreiform movement in the legs, I recommended an MRI, but she declined that stating that even if it found a tumor, she would not do anything with it.  -I talked to the patient about the difference between idiopathic Parkinson's disease and vascular parkinsonism.  I told the patient that carbidopa/levodopa only works about 30% of the time in patients with vascular parkinsonism, and in those patients that it does work, it usually only works for a few years.  The patient would like to try the medication.  She has been on this at twice a day dosing in the past, but this was likely not enough.  I would work her up to 3 times a day dosing, 30 minutes prior to meals, and if not helpful and if she does not have side effects, then I would recommend pushing the dose further.  -If the above proves not to be beneficial, one could certainly try primidone for tremor.  I  would avoid the pure anticholinergic drugs like Artane or Cogentin given that she had cognitive dulling even with gabapentin.  -The patient would like to follow-up with Dr. Malvin Johns, since he is much closer physically and I think that is very reasonable.  I told her to make an appointment with him within the next 3 months and the patient and her family expressed understanding.  -Much greater than 50% of this visit was spent in counseling and coordinating care.  Total face to face time:  65 min

## 2015-10-13 NOTE — Patient Instructions (Signed)
Start Carbidopa Levodopa as follows:  Take 1/2 tablet three times daily, at least 30 minutes before meals, for one week  Then take 1/2 tablet in the morning, 1/2 tablet in the afternoon, 1 tablet in the evening, at least 30 minutes before meals, for one week  Then take 1/2 tablet in the morning, 1 tablet in the afternoon, 1 tablet in the evening, at least 30 minutes before meals, for one week  Then take 1 tablet three times daily, at least 30 minutes before meals Make follow up with Dr Malvin JohnsPotter in the next 3 months.

## 2015-11-05 ENCOUNTER — Emergency Department (HOSPITAL_COMMUNITY): Payer: Medicare Other

## 2015-11-05 ENCOUNTER — Observation Stay (HOSPITAL_COMMUNITY)
Admission: EM | Admit: 2015-11-05 | Discharge: 2015-11-05 | Disposition: A | Discharge status: Home / Self Care | Payer: Medicare Other | Attending: Emergency Medicine | Admitting: Emergency Medicine

## 2015-11-05 ENCOUNTER — Encounter (HOSPITAL_COMMUNITY): Payer: Self-pay | Admitting: Emergency Medicine

## 2015-11-05 DIAGNOSIS — Z86718 Personal history of other venous thrombosis and embolism: Secondary | ICD-10-CM | POA: Insufficient documentation

## 2015-11-05 DIAGNOSIS — K219 Gastro-esophageal reflux disease without esophagitis: Secondary | ICD-10-CM | POA: Insufficient documentation

## 2015-11-05 DIAGNOSIS — Z9981 Dependence on supplemental oxygen: Secondary | ICD-10-CM | POA: Insufficient documentation

## 2015-11-05 DIAGNOSIS — I509 Heart failure, unspecified: Secondary | ICD-10-CM | POA: Insufficient documentation

## 2015-11-05 DIAGNOSIS — I252 Old myocardial infarction: Secondary | ICD-10-CM | POA: Diagnosis not present

## 2015-11-05 DIAGNOSIS — IMO0001 Reserved for inherently not codable concepts without codable children: Secondary | ICD-10-CM

## 2015-11-05 DIAGNOSIS — Z79899 Other long term (current) drug therapy: Secondary | ICD-10-CM | POA: Diagnosis not present

## 2015-11-05 DIAGNOSIS — R938 Abnormal findings on diagnostic imaging of other specified body structures: Secondary | ICD-10-CM

## 2015-11-05 DIAGNOSIS — R0789 Other chest pain: Principal | ICD-10-CM

## 2015-11-05 DIAGNOSIS — I249 Acute ischemic heart disease, unspecified: Secondary | ICD-10-CM

## 2015-11-05 DIAGNOSIS — R079 Chest pain, unspecified: Secondary | ICD-10-CM

## 2015-11-05 DIAGNOSIS — G20 Parkinson's disease: Secondary | ICD-10-CM | POA: Diagnosis not present

## 2015-11-05 DIAGNOSIS — R0602 Shortness of breath: Secondary | ICD-10-CM | POA: Insufficient documentation

## 2015-11-05 DIAGNOSIS — R918 Other nonspecific abnormal finding of lung field: Secondary | ICD-10-CM

## 2015-11-05 HISTORY — DX: Acute myocardial infarction, unspecified: I21.9

## 2015-11-05 HISTORY — DX: Heart failure, unspecified: I50.9

## 2015-11-05 HISTORY — DX: Parkinson's disease: G20

## 2015-11-05 HISTORY — DX: Dependence on supplemental oxygen: Z99.81

## 2015-11-05 HISTORY — DX: Parkinson's disease without dyskinesia, without mention of fluctuations: G20.A1

## 2015-11-05 LAB — CBC WITH DIFFERENTIAL/PLATELET
BASOS ABS: 0 10*3/uL (ref 0.0–0.1)
BASOS PCT: 0 %
EOS ABS: 0.6 10*3/uL (ref 0.0–0.7)
Eosinophils Relative: 9 %
HEMATOCRIT: 37 % (ref 36.0–46.0)
HEMOGLOBIN: 11.6 g/dL — AB (ref 12.0–15.0)
Lymphocytes Relative: 19 %
Lymphs Abs: 1.4 10*3/uL (ref 0.7–4.0)
MCH: 30.8 pg (ref 26.0–34.0)
MCHC: 31.4 g/dL (ref 30.0–36.0)
MCV: 98.1 fL (ref 78.0–100.0)
MONOS PCT: 13 %
Monocytes Absolute: 0.9 10*3/uL (ref 0.1–1.0)
NEUTROS ABS: 4.2 10*3/uL (ref 1.7–7.7)
NEUTROS PCT: 59 %
Platelets: 259 10*3/uL (ref 150–400)
RBC: 3.77 MIL/uL — AB (ref 3.87–5.11)
RDW: 13.4 % (ref 11.5–15.5)
WBC: 7.1 10*3/uL (ref 4.0–10.5)

## 2015-11-05 LAB — BASIC METABOLIC PANEL
ANION GAP: 6 (ref 5–15)
BUN: 12 mg/dL (ref 6–20)
CALCIUM: 8.8 mg/dL — AB (ref 8.9–10.3)
CO2: 32 mmol/L (ref 22–32)
Chloride: 102 mmol/L (ref 101–111)
Creatinine, Ser: 0.94 mg/dL (ref 0.44–1.00)
GFR, EST NON AFRICAN AMERICAN: 53 mL/min — AB (ref >60)
GLUCOSE: 88 mg/dL (ref 65–99)
POTASSIUM: 4.4 mmol/L (ref 3.5–5.1)
Sodium: 140 mmol/L (ref 135–145)

## 2015-11-05 LAB — I-STAT TROPONIN, ED: TROPONIN I, POC: 0.01 ng/mL (ref 0.00–0.08)

## 2015-11-05 LAB — BRAIN NATRIURETIC PEPTIDE: B Natriuretic Peptide: 131.9 pg/mL — ABNORMAL HIGH (ref 0.0–100.0)

## 2015-11-05 LAB — TROPONIN I

## 2015-11-05 LAB — D-DIMER, QUANTITATIVE (NOT AT ARMC): D DIMER QUANT: 6.08 ug{FEU}/mL — AB (ref 0.00–0.50)

## 2015-11-05 MED ORDER — HEPARIN BOLUS VIA INFUSION
4000.0000 [IU] | Freq: Once | INTRAVENOUS | Status: AC
  Administered 2015-11-05: 4000 [IU] via INTRAVENOUS
  Filled 2015-11-05: qty 4000

## 2015-11-05 MED ORDER — LIDOCAINE HCL 2 % EX GEL
1.0000 "application " | Freq: Once | CUTANEOUS | Status: AC
  Administered 2015-11-05: 1 via TOPICAL
  Filled 2015-11-05: qty 20

## 2015-11-05 MED ORDER — NITROGLYCERIN 0.4 MG SL SUBL: 0.4000 mg | SUBLINGUAL_TABLET | SUBLINGUAL | Status: DC | PRN

## 2015-11-05 MED ORDER — GI COCKTAIL ~~LOC~~
30.0000 mL | Freq: Once | ORAL | Status: AC
  Administered 2015-11-05: 30 mL via ORAL
  Filled 2015-11-05: qty 30

## 2015-11-05 MED ORDER — IOPAMIDOL (ISOVUE-370) INJECTION 76%
INTRAVENOUS | Status: AC
  Administered 2015-11-05: 100 mL
  Filled 2015-11-05: qty 100

## 2015-11-05 MED ORDER — HEPARIN (PORCINE) IN NACL 100-0.45 UNIT/ML-% IJ SOLN
1300.0000 [IU]/h | INTRAMUSCULAR | Status: DC
  Administered 2015-11-05: 1300 [IU]/h via INTRAVENOUS
  Filled 2015-11-05 (×2): qty 250

## 2015-11-05 NOTE — ED Notes (Signed)
Dr. Konrad DoloresMerrell called advising this RN that after speaking with Dr. Jacinto HalimGanji, they feel that patient can be discharged home with meloxicam and that she does not need a cardiology follow up. Dr. Konrad DoloresMerrell requested that this RN advised Dr. Madilyn Hookees of change in plan for patient so that she can put patient up for discharge.

## 2015-11-05 NOTE — ED Notes (Signed)
3E called and made aware that patient is getting discharged and will not be coming up to the floor.

## 2015-11-05 NOTE — ED Notes (Signed)
ptar at bedside. 

## 2015-11-05 NOTE — ED Notes (Signed)
Patient transported to CT 

## 2015-11-05 NOTE — Consult Note (Signed)
CARDIOLOGY CONSULT NOTE  Patient IDKaeley Olson MRN: [id number] DOB/AGE: 80-Jun-1927 35 y.o.  Admit date: 11/05/2015 Referring Physician  Jennifer Canter, MD Primary Physician:  Jennifer Leitz, NP Reason for Consultation  Abnormal EKG and chest pain  HPI: Jennifer Olson  is a 82 y.o. female  With Patient has history of migraine headaches, parkinsonian disease, who lives in a nursing home, this morning was being visited by son-in-law, at that time was asymptomatic around 7 AM. Around 8 AM vision started to complain of chest pain, she was transferred to [facility] emergency room for further evaluation. Initially STEMI was activated due to underlying left bundle branch block by the EMS. However patient has underlying chronic left bundle branch block. Hence this was canceled. I was consulted to evaluate ongoing chest pain. Patient had at least 2 hours of chest pain prior to being transferred to the hospital.  Patient's daughter and son-in-law present the bedside. Patient continues to have chest pain which he points 2 fingers on the left upper part of the chest and states that pressing the chest hurts significantly. No associated chest pain with deep breath, no radiation of chest pain, no shortness of breath, no cough, no fever, no PND or orthopnea. She denies any palpitations.  Past Medical History  Diagnosis Date  . Arthritis   . Depression   . History of fainting spells of unknown cause   . Migraines   . Blood transfusion without reported diagnosis   . UTI (urinary tract infection)   . Urinary incontinence   . Aneurysm of abdominal aorta (HCC)   . Aneurysm artery, iliac (HCC)   . CHF (congestive heart failure) (HCC)   . Oxygen dependent     Pt unsure of why she is on oxygen.  [name] Heart attack (HCC)   . Parkinson disease [facility])      Past Surgical History  Procedure Laterality Date  . Breast surgery      Biopsy  . Appendectomy  1980  . Tonsillectomy and adenoidectomy  1934  .  Cesarean section      1956/1962  . Foot surgery  1976    Both Feet  . Knee surgery      Both Knees/1999/2000  . Broken hip  2015    Right Hip  . Femur im nail Left 02/21/2015    Procedure: INTRAMEDULLARY (IM) NAIL FEMORAL;  Surgeon: Juanell Fairly, MD;  Location: [facility] ORS;  Service: Orthopedics;  Laterality: Left;     Family History  Problem Relation Age of Onset  . Sudden death Brother      Social History: Social History   Social History  . Marital Status: Widowed    Spouse Name: N/A  . Number of Children: N/A  . Years of Education: N/A   Occupational History  . Not on file.   Social History Main Topics  . Smoking status: Never Smoker   . Smokeless tobacco: Not on file  . Alcohol Use: 0.0 oz/week    0 Standard drinks or equivalent per week     Comment: wine occasionally  . Drug Use: No  . Sexual Activity: No   Other Topics Concern  . Not on file   Social History Narrative   Patient has recently moved with family from [place] to [place]. Patient is currently living with daughter, son-in-law, and granddaughter.   Patient is retired and widowed.   Patient reports drinking 1 cup of coffee in the morning         (  Not in a hospital admission)   ROS: General: no fevers/chills/night sweats Eyes: no blurry vision, diplopia, or amaurosis ENT: no sore throat or hearing loss Resp: no cough, wheezing, or hemoptysis CV: Has chronic leg edema  GI: no abdominal pain, nausea, vomiting, diarrhea, or constipation GU: no dysuria, frequency, or hematuria Skin: no rash Neuro: no headache, numbness, tingling, or weakness of extremities Musculoskeletal: no joint pain or swelling Heme: no bleeding, DVT, or easy bruising Endo: no polydipsia or polyuria    Physical Exam: Blood pressure 133/65, pulse 69, temperature 99.7 F (37.6 C), temperature source Oral, resp. rate 16, height 5' 4.96" (1.65 m), weight 98.4 kg (216 lb 14.9 oz), SpO2 96 %.   General appearance:  alert, cooperative, appears stated age, no distress and mildly obese Lungs: clear to auscultation bilaterally Heart: regular rate and rhythm, S1, S2 normal, no murmur, click, rub or gallop Abdomen: soft, non-tender; bowel sounds normal; no masses,  no organomegaly and obese Extremities: edema 2-3+, pitting. No clinical evidence of venous insufficiency or dermatitis or stasis ulcers. and Will range of movements. Pulses: 2+ and symmetric Neurologic: Grossly normal except for parkinsonian tremors.  Labs:   Lab Results  Component Value Date   WBC 7.1 11/05/2015   HGB 11.6* 11/05/2015   HCT 37.0 11/05/2015   MCV 98.1 11/05/2015   PLT 259 11/05/2015    Recent Labs Lab 11/05/15 1013  NA 140  K 4.4  CL 102  CO2 32  BUN 12  CREATININE 0.94  CALCIUM 8.8*  GLUCOSE 88   Recent Labs  11/05/15 1013  BNP 131.9*   Lab Results  Component Value Date   TROPONINI 0.03 02/20/2015    EKG: unchanged from previous tracings, LBBB, no further analysis due to LBBB. Radiology: Ct Angio Chest Pe W/cm &/or Wo Cm  11/05/2015  CLINICAL DATA:  82 year old female with acute chest pain and shortness of breath today. EXAM: CT ANGIOGRAPHY CHEST WITH CONTRAST TECHNIQUE: Multidetector CT imaging of the chest was performed using the standard protocol during bolus administration of intravenous contrast. Multiplanar CT image reconstructions and MIPs were obtained to evaluate the vascular anatomy. CONTRAST:  100 cc intravenous Isovue 370 COMPARISON:  11/05/2015 and prior radiographs FINDINGS: Cardiovascular: This is a technically adequate study although respiratory motion artifact decreases sensitivity and 4 sons of the lungs. There is no evidence of pulmonary emboli. The thoracic aorta is tortuous and ectatic, with the transverse aorta measuring 3.7 cm in greatest diameter. Cardiomegaly and scratch de cardiomegaly identified. Mediastinum/Nodes: Shotty mediastinal and right hilar lymph nodes are identified. There is  no evidence of pericardial effusion or mediastinal mass. Lungs/Pleura: Ground-glass opacities within the central and lower lungs identified. Mild dependent and basilar scarring/ atelectasis identified. There is no evidence of pleural effusion or pneumothorax. No pulmonary mass identified. Upper Abdomen: No acute abnormality. Abdominal aortic stent identified. Musculoskeletal: A moderate severe thoracolumbar scoliosis identified. No acute abnormality noted. Review of the MIP images confirms the above findings. IMPRESSION: No evidence of pulmonary emboli. Bilateral central and lower lung ground-glass opacities which are nonspecific and may represent infection or inflammation. Cardiomegaly with tortuous and ectatic thoracic aorta. Aortic atherosclerosis. Electronically Signed   By: Harmon Pier M.D.   On: 11/05/2015 13:10   Dg Chest Port 1 View  11/05/2015  CLINICAL DATA:  Central chest tightness beginning on awakening this morning, low oxygen saturation, shortness of breath EXAM: PORTABLE CHEST 1 VIEW COMPARISON:  Portable exam 1017 hours compared to 02/20/2015 FINDINGS: Enlargement of cardiac silhouette  with slight pulmonary vascular congestion. Atherosclerotic calcification aorta. Slight chronic accentuation of pulmonary markings without gross acute infiltrate, pleural effusion or pneumothorax Central peribronchial thickening noted. Bones diffusely demineralized with chronic rotator cuff tears and pronounced dextroconvex thoracic scoliosis. IMPRESSION: Enlargement of cardiac silhouette with pulmonary vascular congestion. Minimal bronchitic changes without acute infiltrate. Aortic atherosclerosis. Electronically Signed   By: Ulyses SouthwardMark  Boles M.D.   On: 11/05/2015 10:30    Scheduled Meds:  Continuous Infusions: . heparin Stopped (11/05/15 1548)   PRN Meds:.nitroGLYCERIN  ASSESSMENT AND PLAN:  1. Musculoskeletal chest pain, left upper chest, point tenderness, easily reproducible. 2. Left bundle branch block,  chronic 3. Chronic leg edema probably due to stasis and dependency 4. Senile  parkinsonian tremors  Recommendation: I do not suspect ACS. Chest pain clearly musculoskeletal. Would recommend ruling out for myocardial infarction by repeat serum troponin, I would not recommend any further cardiac evaluation. In spite of chest pain being present for at least 2 hours prior to the arrival to the emergency room, serum troponin is negative, no new EKG changes.  Also given elderly status, unless clearly symptoms are suggestive of ACS, would do no further analysis. Patient has history of aortic aneurysms and iliac artery aneurysms, however CT scan of the chest only shows ectasia without aneurysmal dilatations.  Please call if questions. Stable from cardiac standpoint.  Yates DecampGANJI, Charizma Gardiner, MD 11/05/2015, 4:49 PM [facility] Pager: [phone] Office: [phone] If no answer Cell [phone]

## 2015-11-05 NOTE — ED Notes (Signed)
[facility] with complaint of central chest tightness onset on awakening this morning. Received 324 mg aspirin and 2 nitro PTA. Staff reports patient O2 sats dropped during onset of cp this morning. Wears daily O2 at 3L. EKG showing left BBB. VSS per EMS.

## 2015-11-05 NOTE — Progress Notes (Signed)
ANTICOAGULATION CONSULT NOTE - Initial Consult  Pharmacy Consult for heparin Indication: pulmonary embolus and DVT  Allergies  Allergen Reactions  . Penicillins Rash    Patient Measurements: Height: 5' 4.96" (165 cm) Weight: 216 lb 14.9 oz (98.4 kg) IBW/kg (Calculated) : 56.91 Heparin Dosing Weight: 79.3 kg  Vital Signs: Temp: 99.7 F (37.6 C) (07/12 1039) Temp Source: Oral (07/12 1039) BP: 129/62 mmHg (07/12 1215) Pulse Rate: 69 (07/12 1215)  Labs:  Recent Labs  11/05/15 1013  HGB 11.6*  HCT 37.0  PLT 259  CREATININE 0.94    Estimated Creatinine Clearance: 48 mL/min (by C-G formula based on Cr of 0.94).  Assessment: 82 yo f presenting with central chest tightness upon awakening. O2 sats dropped during onset of CP.  PMH: hx of DVT  AC: none pta (med rec pending - from notes looks like may be on enox 40 at SNF)- now iv hep for PE? - pending CTA  Renal: SCr 0.94  Heme: H&H 11.6/37, Plt 259 - DDimer 6.08  Goal of Therapy:  Heparin level 0.3-0.7 units/ml Monitor platelets by anticoagulation protocol: Yes   Plan:  Heparin 4000 unit bolus Heparin gtt 1300 units/hr Initial HL @ 2100 Daily HL,CBC F/U CTA results  Isaac BlissMichael Neema Barreira, PharmD, BCPS, [facility]CC[facility] Clinical Pharmacist Pager [phone] 11/05/2015 12:53 PM

## 2015-11-05 NOTE — Consult Note (Signed)
   Asked to evaluate possible STEMI  82 YO SNF resident who developed severe chest pain between 7-7:00 AM. Prior H?O MI but never treated in hospital. Currently with minimal CP.  ECG shows LBBB not significantly different than older prior tracing.  Plan ER evaluation, cancel STEMI and further manage based on findings.  She is elderly and frail. Invasive approach may not be prudent.

## 2015-11-05 NOTE — ED Notes (Signed)
STEMI team at bedside canceling at this time. Blood work to be obtained.

## 2015-11-05 NOTE — Consult Note (Addendum)
Consult Note   Jennifer Olson ZOX:[id number] DOB: 1926/03/11 DOA: 11/05/2015  PCP: Carollee Leitz, NP Patient coming from: Home  Chief Complaint: CP  HPI: Jennifer Olson is a 82 y.o. female with medical history significant of ??? Afib???, Arthritis, depression, migraines, AAA.  Chest pain. Substernal. Started approximately 6:30 this morning. Pressure type sensation. Associated shortness of breath. Fairly constant with waxing and waning nature. Gradually improving. Not related with exertion. Denies any shortness of breath, diaphoresis, nausea. Patient was given aspirin and nitroglycerin in the ED which did nothing for her chest pain. Patient was brought to the emergency room initially as a code STEMI due to an EKG that was obtained by EMS. Upon arrival in the ED patient was evaluated by Dr. Katrinka Blazing who felt that patient was not a STEMI. Pain does not change with deep breathing or movement but is reproducible on palpation of the chest wall. The patient is a very poor historian with regards to her current and past medical histories.    ED Course: As noted above w/ objective findings below  Review of Systems: As per HPI otherwise 10 point review of systems negative.   Ambulatory Status: no restrictions  Past Medical History  Diagnosis Date  . Arthritis   . Depression   . History of fainting spells of unknown cause   . Migraines   . Blood transfusion without reported diagnosis   . UTI (urinary tract infection)   . Urinary incontinence   . Aneurysm of abdominal aorta (HCC)   . Aneurysm artery, iliac (HCC)   . CHF (congestive heart failure) (HCC)   . Oxygen dependent     Pt unsure of why she is on oxygen.  [name] Heart attack (HCC)   . Parkinson disease [facility])     Past Surgical History  Procedure Laterality Date  . Breast surgery      Biopsy  . Appendectomy  1980  . Tonsillectomy and adenoidectomy  1934  . Cesarean section      1956/1962  . Foot surgery  1976    Both Feet  . Knee surgery       Both Knees/1999/2000  . Broken hip  2015    Right Hip  . Femur im nail Left 02/21/2015    Procedure: INTRAMEDULLARY (IM) NAIL FEMORAL;  Surgeon: Juanell Fairly, MD;  Location: [facility] ORS;  Service: Orthopedics;  Laterality: Left;    Social History   Social History  . Marital Status: Widowed    Spouse Name: N/A  . Number of Children: N/A  . Years of Education: N/A   Occupational History  . Not on file.   Social History Main Topics  . Smoking status: Never Smoker   . Smokeless tobacco: Not on file  . Alcohol Use: 0.0 oz/week    0 Standard drinks or equivalent per week     Comment: wine occasionally  . Drug Use: No  . Sexual Activity: No   Other Topics Concern  . Not on file   Social History Narrative   Patient has recently moved with family from [place] to [place]. Patient is currently living with daughter, son-in-law, and granddaughter.   Patient is retired and widowed.   Patient reports drinking 1 cup of coffee in the morning       Allergies  Allergen Reactions  . Penicillins Rash    Has patient had a PCN reaction causing immediate rash, facial/tongue/throat swelling, SOB or lightheadedness with hypotension:  Has patient had a PCN reaction causing  severe rash involving mucus membranes or skin necrosis:  Has patient had a PCN reaction that required hospitalization  Has patient had a PCN reaction occurring within the last 10 years:  If all of the above answers are "NO", then may proceed with Cephalosporin use.    Family History  Problem Relation Age of Onset  . Sudden death Brother     Prior to Admission medications   Medication Sig Start Date End Date Taking? Authorizing Provider  baclofen (LIORESAL) 10 MG tablet Take 5 mg by mouth 2 (two) times daily.    Historical Provider, MD  busPIRone (BUSPAR) 5 MG tablet Take 1 tablet (5 mg total) by mouth 2 (two) times daily. 01/02/15   Carollee Leitz, NP  carbidopa-levodopa (SINEMET IR) 25-100 MG tablet Take 1  tablet by mouth 3 (three) times daily. 10/13/15   Rebecca S Tat, DO  escitalopram (LEXAPRO) 10 MG tablet Take 1 tablet (10 mg total) by mouth daily. 01/02/15   Carollee Leitz, NP  ferrous sulfate 325 (65 FE) MG tablet Take 1 tablet (325 mg total) by mouth 3 (three) times daily after meals. 02/24/15   [name], MD  fluticasone (FLONASE) 50 MCG/ACT nasal spray Place 2 sprays into both nostrils daily as needed for allergies or rhinitis.     Historical Provider, MD  furosemide (LASIX) 40 MG tablet Take 1 tablet (40 mg total) by mouth daily. Patient taking differently: Take 60 mg by mouth daily.  01/02/15   Carollee Leitz, NP  meloxicam (MOBIC) 15 MG tablet Take 1 tablet (15 mg total) by mouth daily. 01/02/15   Carollee Leitz, NP  oxyCODONE (OXY IR/ROXICODONE) 5 MG immediate release tablet Take 1 tablet (5 mg total) by mouth every 6 (six) hours as needed for severe pain. Patient taking differently: Take 10 mg by mouth 4 (four) times daily.  02/14/15   Carollee Leitz, NP  pantoprazole (PROTONIX) 20 MG tablet Take 1 tablet (20 mg total) by mouth daily. 01/02/15   Carollee Leitz, NP  polyethylene glycol (MIRALAX / GLYCOLAX) packet Take 17 g by mouth daily.    Historical Provider, MD  Potassium Chloride ER 20 MEQ TBCR Take 40 mEq by mouth daily with breakfast. 01/02/15   Carollee Leitz, NP  senna (SENOKOT) 8.6 MG tablet Take 1 tablet by mouth daily.    Historical Provider, MD  simvastatin (ZOCOR) 20 MG tablet Take 1 tablet (20 mg total) by mouth at bedtime. 01/02/15   Carollee Leitz, NP  traMADol (ULTRAM) 50 MG tablet Take 1 tablet (50 mg total) by mouth every 8 (eight) hours as needed for moderate pain. Patient taking differently: Take 25 mg by mouth 2 (two) times daily.  02/24/15   [name], MD  traZODone (DESYREL) 50 MG tablet Take 1 tablet (50 mg total) by mouth at bedtime. Patient taking differently: Take 25 mg by mouth at bedtime.  01/02/15   Carollee Leitz, NP    Physical Exam: Filed Vitals:   11/05/15 1445  11/05/15 1500 11/05/15 1510 11/05/15 1515  BP: 141/76 129/59  133/65  Pulse: 71 66 68 69  Temp:      TempSrc:      Resp: Height:      Weight:      SpO2: 95% 94% 96% 96%     General:  Appears calm and comfortable Eyes:  PERRL, EOMI, normal lids, iris ENT:  grossly normal hearing, lips & tongue, mmm  Neck:  no LAD, masses or thyromegaly Cardiovascular:  RRR, no m/r/g. No LE edema.  Respiratory:  CTA bilaterally, no w/r/r. Normal respiratory effort. Abdomen:  soft, ntnd, NABS Skin:  no rash or induration seen on limited exam Musculoskeletal: CP reproducible on palpation,  grossly normal tone BUE/BLE, good ROM, no bony abnormality Psychiatric:  grossly normal mood and affect, speech fluent and appropriate, AOx3 Neurologic:  CN 2-12 grossly intact, moves all extremities in coordinated fashion, sensation intact  Labs on Admission: I have personally reviewed following labs and imaging studies  CBC:  Recent Labs Lab 11/05/15 1013  WBC 7.1  NEUTROABS 4.2  HGB 11.6*  HCT 37.0  MCV 98.1  PLT 259   Basic Metabolic Panel:  Recent Labs Lab 11/05/15 1013  NA 140  K 4.4  CL 102  CO2 32  GLUCOSE 88  BUN 12  CREATININE 0.94  CALCIUM 8.8*   GFR: Estimated Creatinine Clearance: 48 mL/min (by C-G formula based on Cr of 0.94). Liver Function Tests: No results for input(s): AST, ALT, ALKPHOS, BILITOT, PROT, ALBUMIN in the last 168 hours. No results for input(s): LIPASE, AMYLASE in the last 168 hours. No results for input(s): AMMONIA in the last 168 hours. Coagulation Profile: No results for input(s): INR, PROTIME in the last 168 hours. Cardiac Enzymes:  Recent Labs Lab 11/05/15 1620  TROPONINI <0.03   BNP (last 3 results) No results for input(s): PROBNP in the last 8760 hours. HbA1C: No results for input(s): HGBA1C in the last 72 hours. CBG: No results for input(s): GLUCAP in the last 168 hours. Lipid Profile: No results for input(s): CHOL, HDL, LDLCALC,  TRIG, CHOLHDL, LDLDIRECT in the last 72 hours. Thyroid Function Tests: No results for input(s): TSH, T4TOTAL, FREET4, T3FREE, THYROIDAB in the last 72 hours. Anemia Panel: No results for input(s): VITAMINB12, FOLATE, FERRITIN, TIBC, IRON, RETICCTPCT in the last 72 hours. Urine analysis:    Component Value Date/Time   COLORURINE COLORLESS* 02/20/2015 1502   APPEARANCEUR CLEAR* 02/20/2015 1502   LABSPEC 1.005 02/20/2015 1502   PHURINE 8.0 02/20/2015 1502   GLUCOSEU NEGATIVE 02/20/2015 1502   HGBUR NEGATIVE 02/20/2015 1502   BILIRUBINUR NEGATIVE 02/20/2015 1502   KETONESUR NEGATIVE 02/20/2015 1502   PROTEINUR NEGATIVE 02/20/2015 1502   NITRITE NEGATIVE 02/20/2015 1502   LEUKOCYTESUR NEGATIVE 02/20/2015 1502    Creatinine Clearance: Estimated Creatinine Clearance: 48 mL/min (by C-G formula based on Cr of 0.94).  Sepsis Labs: @LABRCNTIP (procalcitonin:4,lacticidven:4) )No results found for this or any previous visit (from the past 240 hour(s)).   Radiological Exams on Admission: Ct Angio Chest Pe W/cm &/or Wo Cm  11/05/2015  CLINICAL DATA:  82 year old female with acute chest pain and shortness of breath today. EXAM: CT ANGIOGRAPHY CHEST WITH CONTRAST TECHNIQUE: Multidetector CT imaging of the chest was performed using the standard protocol during bolus administration of intravenous contrast. Multiplanar CT image reconstructions and MIPs were obtained to evaluate the vascular anatomy. CONTRAST:  100 cc intravenous Isovue 370 COMPARISON:  11/05/2015 and prior radiographs FINDINGS: Cardiovascular: This is a technically adequate study although respiratory motion artifact decreases sensitivity and 4 sons of the lungs. There is no evidence of pulmonary emboli. The thoracic aorta is tortuous and ectatic, with the transverse aorta measuring 3.7 cm in greatest diameter. Cardiomegaly and scratch de cardiomegaly identified. Mediastinum/Nodes: Shotty mediastinal and right hilar lymph nodes are  identified. There is no evidence of pericardial effusion or mediastinal mass. Lungs/Pleura: Ground-glass opacities within the central and lower lungs identified. Mild dependent and  basilar scarring/ atelectasis identified. There is no evidence of pleural effusion or pneumothorax. No pulmonary mass identified. Upper Abdomen: No acute abnormality. Abdominal aortic stent identified. Musculoskeletal: A moderate severe thoracolumbar scoliosis identified. No acute abnormality noted. Review of the MIP images confirms the above findings. IMPRESSION: No evidence of pulmonary emboli. Bilateral central and lower lung ground-glass opacities which are nonspecific and may represent infection or inflammation. Cardiomegaly with tortuous and ectatic thoracic aorta. Aortic atherosclerosis. Electronically Signed   By: Harmon Pier M.D.   On: 11/05/2015 13:10   Dg Chest Port 1 View  11/05/2015  CLINICAL DATA:  Central chest tightness beginning on awakening this morning, low oxygen saturation, shortness of breath EXAM: PORTABLE CHEST 1 VIEW COMPARISON:  Portable exam 1017 hours compared to 02/20/2015 FINDINGS: Enlargement of cardiac silhouette with slight pulmonary vascular congestion. Atherosclerotic calcification aorta. Slight chronic accentuation of pulmonary markings without gross acute infiltrate, pleural effusion or pneumothorax Central peribronchial thickening noted. Bones diffusely demineralized with chronic rotator cuff tears and pronounced dextroconvex thoracic scoliosis. IMPRESSION: Enlargement of cardiac silhouette with pulmonary vascular congestion. Minimal bronchitic changes without acute infiltrate. Aortic atherosclerosis. Electronically Signed   By: Ulyses Southward M.D.   On: 11/05/2015 10:30    EKG: Independently reviewed. Sinus with significant artifact from tremor from Parkinson's. LBBB. No sign of ACS.  Assessment/Plan Active Problems:   Chest pain   Abnormal CT lung screening   Chest wall pain   Reflux     CP: HEART score 5. Evaluated by Cardiology, Dr. Jacinto Halim, who feels pt is safe for DC. Of note chest pain is reproducible on palpation and was not relieved with aspirin or nitroglycerin. Also chest pain does not have any of the typical associated symptoms such as shortness of breath, nausea, diaphoresis or radiation to the neck or shoulder. Chest pain was unchanged with exertion. - Patient may be safely discharged after second troponin as long as it is normal. - GI cocktail ordered. - Continue meloxicam and consider Voltaren gel for lidocaine gel for chest pain.  CT angiogram normal without evidence of PE but levels no concern for possible infectious etiologies patient is without any fevers, shortness of breath, palpitations, wheezing or any other URI type symptoms. Patient to follow-up with her outpatient PCP if she develops further pulmonary symptoms.  Pt should continue her other chronic medications after discharge.   MERRELL, DAVID J MD [facility]  If 7PM-7AM, please contact night-coverage [email or web address] Password [facility]  11/05/2015, 5:17 PM

## 2015-11-05 NOTE — ED Notes (Signed)
[facility]

## 2015-11-05 NOTE — ED Provider Notes (Signed)
CSN: [id number]     Arrival date & time 11/05/15  1002 History   First MD Initiated Contact with Patient 11/05/15 1009     Chief Complaint  Patient presents with  . Chest Pain    STEMI?     Patient is a 82 y.o. female presenting with chest pain. The history is provided by the patient. No language interpreter was used.  Chest Pain  Jennifer Olson is a 82 y.o. female who presents to the Emergency Department complaining of chest pain.  She reports central chest pain that began about 6:30 or 7 this morning. Pain is a pressure type sensation. She has associated shortness of breath. Pain is waxing and waning. Overall her pain has improved since this morning. She received 324 of aspirin and 2 sublingual nitroglycerin with partial improvement of her pain. She denies any fevers, coughing. She has chronic leg swelling that is at her baseline. She has a history of DVT.  Past Medical History  Diagnosis Date  . Arthritis   . Depression   . History of fainting spells of unknown cause   . Migraines   . Blood transfusion without reported diagnosis   . UTI (urinary tract infection)   . Urinary incontinence   . Aneurysm of abdominal aorta (HCC)   . Aneurysm artery, iliac (HCC)   . CHF (congestive heart failure) (HCC)   . Oxygen dependent     Pt unsure of why she is on oxygen.  [name]. Heart attack (HCC)   . Parkinson disease [facility](HCC)    Past Surgical History  Procedure Laterality Date  . Breast surgery      Biopsy  . Appendectomy  1980  . Tonsillectomy and adenoidectomy  1934  . Cesarean section      1956/1962  . Foot surgery  1976    Both Feet  . Knee surgery      Both Knees/1999/2000  . Broken hip  2015    Right Hip  . Femur im nail Left 02/21/2015    Procedure: INTRAMEDULLARY (IM) NAIL FEMORAL;  Surgeon: Juanell FairlyKevin Krasinski, MD;  Location: [facility] ORS;  Service: Orthopedics;  Laterality: Left;   Family History  Problem Relation Age of Onset  . Sudden death Brother    Social History  Substance  Use Topics  . Smoking status: Never Smoker   . Smokeless tobacco: None  . Alcohol Use: 0.0 oz/week    0 Standard drinks or equivalent per week     Comment: wine occasionally   OB History    No data available     Review of Systems  Cardiovascular: Positive for chest pain.  All other systems reviewed and are negative.     Allergies  Penicillins  Home Medications   Prior to Admission medications   Medication Sig Start Date End Date Taking? Authorizing Provider  baclofen (LIORESAL) 10 MG tablet Take 5 mg by mouth 2 (two) times daily.    Historical Provider, MD  busPIRone (BUSPAR) 5 MG tablet Take 1 tablet (5 mg total) by mouth 2 (two) times daily. 01/02/15   Carollee Leitzarrie M Doss, NP  carbidopa-levodopa (SINEMET IR) 25-100 MG tablet Take 1 tablet by mouth 3 (three) times daily. 10/13/15   Rebecca S Tat, DO  escitalopram (LEXAPRO) 10 MG tablet Take 1 tablet (10 mg total) by mouth daily. 01/02/15   Carollee Leitzarrie M Doss, NP  ferrous sulfate 325 (65 FE) MG tablet Take 1 tablet (325 mg total) by mouth 3 (three) times daily after meals. 02/24/15  [name], MD  fluticasone [facility]) 50 MCG/ACT nasal spray Place 2 sprays into both nostrils daily as needed for allergies or rhinitis.     Historical Provider, MD  furosemide (LASIX) 40 MG tablet Take 1 tablet (40 mg total) by mouth daily. Patient taking differently: Take 60 mg by mouth daily.  01/02/15   Carollee Leitz, NP  meloxicam (MOBIC) 15 MG tablet Take 1 tablet (15 mg total) by mouth daily. 01/02/15   Carollee Leitz, NP  oxyCODONE (OXY IR/ROXICODONE) 5 MG immediate release tablet Take 1 tablet (5 mg total) by mouth every 6 (six) hours as needed for severe pain. Patient taking differently: Take 10 mg by mouth 4 (four) times daily.  02/14/15   Carollee Leitz, NP  pantoprazole (PROTONIX) 20 MG tablet Take 1 tablet (20 mg total) by mouth daily. 01/02/15   Carollee Leitz, NP  polyethylene glycol (MIRALAX / GLYCOLAX) packet Take 17 g by mouth daily.    Historical  Provider, MD  Potassium Chloride ER 20 MEQ TBCR Take 40 mEq by mouth daily with breakfast. 01/02/15   Carollee Leitz, NP  senna (SENOKOT) 8.6 MG tablet Take 1 tablet by mouth daily.    Historical Provider, MD  simvastatin (ZOCOR) 20 MG tablet Take 1 tablet (20 mg total) by mouth at bedtime. 01/02/15   Carollee Leitz, NP  traMADol (ULTRAM) 50 MG tablet Take 1 tablet (50 mg total) by mouth every 8 (eight) hours as needed for moderate pain. Patient taking differently: Take 25 mg by mouth 2 (two) times daily.  02/24/15   [name], MD  traZODone (DESYREL) 50 MG tablet Take 1 tablet (50 mg total) by mouth at bedtime. Patient taking differently: Take 25 mg by mouth at bedtime.  01/02/15   Carollee Leitz, NP   BP 133/65 mmHg  Pulse 69  Temp(Src) 99.7 F (37.6 C) (Oral)  Resp 16  Ht 5' 4.96" (1.65 m)  Wt 216 lb 14.9 oz (98.4 kg)  BMI 36.14 kg/m2  SpO2 96% Physical Exam  Constitutional: She is oriented to person, place, and time. She appears well-developed and well-nourished.  HENT:  Head: Normocephalic and atraumatic.  Cardiovascular: Normal rate and regular rhythm.   No murmur heard. Pulmonary/Chest: Effort normal. No respiratory distress.  Occasional rhonchi in the bases  Abdominal: Soft. There is no tenderness. There is no rebound and no guarding.  Musculoskeletal:  2-3+ pitting edema in bilateral lower extremities.  Neurological: She is alert and oriented to person, place, and time.  Skin: Skin is warm and dry.  Psychiatric: She has a normal mood and affect. Her behavior is normal.  Nursing note and vitals reviewed.   ED Course  Procedures (including critical care time) Labs Review Labs Reviewed  BASIC METABOLIC PANEL - Abnormal; Notable for the following:    Calcium 8.8 (*)    GFR calc non Af Amer 53 (*)    All other components within normal limits  BRAIN NATRIURETIC PEPTIDE - Abnormal; Notable for the following:    B Natriuretic Peptide 131.9 (*)    All other components within  normal limits  CBC WITH DIFFERENTIAL/PLATELET - Abnormal; Notable for the following:    RBC 3.77 (*)    Hemoglobin 11.6 (*)    All other components within normal limits  D-DIMER, QUANTITATIVE (NOT AT [facility]) - Abnormal; Notable for the following:    D-Dimer, Quant 6.08 (*)    All other components within normal limits  MRSA PCR SCREENING  HEPARIN LEVEL (UNFRACTIONATED)  TROPONIN I  TROPONIN I  TROPONIN I  I-STAT TROPOININ, ED    Imaging Review Ct Angio Chest Pe W/cm &/or Wo Cm  11/05/2015  CLINICAL DATA:  82 year old female with acute chest pain and shortness of breath today. EXAM: CT ANGIOGRAPHY CHEST WITH CONTRAST TECHNIQUE: Multidetector CT imaging of the chest was performed using the standard protocol during bolus administration of intravenous contrast. Multiplanar CT image reconstructions and MIPs were obtained to evaluate the vascular anatomy. CONTRAST:  100 cc intravenous Isovue 370 COMPARISON:  11/05/2015 and prior radiographs FINDINGS: Cardiovascular: This is a technically adequate study although respiratory motion artifact decreases sensitivity and 4 sons of the lungs. There is no evidence of pulmonary emboli. The thoracic aorta is tortuous and ectatic, with the transverse aorta measuring 3.7 cm in greatest diameter. Cardiomegaly and scratch de cardiomegaly identified. Mediastinum/Nodes: Shotty mediastinal and right hilar lymph nodes are identified. There is no evidence of pericardial effusion or mediastinal mass. Lungs/Pleura: Ground-glass opacities within the central and lower lungs identified. Mild dependent and basilar scarring/ atelectasis identified. There is no evidence of pleural effusion or pneumothorax. No pulmonary mass identified. Upper Abdomen: No acute abnormality. Abdominal aortic stent identified. Musculoskeletal: A moderate severe thoracolumbar scoliosis identified. No acute abnormality noted. Review of the MIP images confirms the above findings. IMPRESSION: No evidence of  pulmonary emboli. Bilateral central and lower lung ground-glass opacities which are nonspecific and may represent infection or inflammation. Cardiomegaly with tortuous and ectatic thoracic aorta. Aortic atherosclerosis. Electronically Signed   By: Harmon Pier M.D.   On: 11/05/2015 13:10   Dg Chest Port 1 View  11/05/2015  CLINICAL DATA:  Central chest tightness beginning on awakening this morning, low oxygen saturation, shortness of breath EXAM: PORTABLE CHEST 1 VIEW COMPARISON:  Portable exam 1017 hours compared to 02/20/2015 FINDINGS: Enlargement of cardiac silhouette with slight pulmonary vascular congestion. Atherosclerotic calcification aorta. Slight chronic accentuation of pulmonary markings without gross acute infiltrate, pleural effusion or pneumothorax Central peribronchial thickening noted. Bones diffusely demineralized with chronic rotator cuff tears and pronounced dextroconvex thoracic scoliosis. IMPRESSION: Enlargement of cardiac silhouette with pulmonary vascular congestion. Minimal bronchitic changes without acute infiltrate. Aortic atherosclerosis. Electronically Signed   By: Ulyses Southward M.D.   On: 11/05/2015 10:30   I have personally reviewed and evaluated these images and lab results as part of my medical decision-making.   EKG Interpretation   Date/Time:  Wednesday November 05 2015 10:19:29 EDT Ventricular Rate:  86 PR Interval:    QRS Duration: 160 QT Interval:  471 QTC Calculation: 564 R Axis:   -57 Text Interpretation:  Atrial fibrillation Left bundle branch block  Interpretation limited secondary to artifact Confirmed by Lincoln Brigham  [phone]) on 11/05/2015 1:03:08 PM      MDM   Final diagnoses:  None    Patient here for evaluation of chest pain and shortness of breath. Her pain has gradually improved in the emergency department without any additional nitroglycerin. She was started on heparin drip for potential ACS versus PE. Cardiology consulted given her ongoing chest  pain and medicine consulted for admission for further treatment/workup.    Tilden Fossa, MD 11/05/15 [phone]

## 2015-11-05 NOTE — ED Notes (Signed)
Pt complained nose is stuffed up which makes breathing difficult.

## 2015-11-05 NOTE — ED Notes (Addendum)
Dr. Jacinto HalimGanji at beside, MD advised this RN to stop patient's heparin drip. Dr. Jacinto HalimGanji also advised patient may be able to be discharged and that he will speak with admitting physician and make a decision.

## 2015-11-05 NOTE — Discharge Instructions (Signed)
Medications can cause your chest pain. Fortunately your pain does not appear to be due to your heart. Her chest pain is likely from something called musculoskeletal pain. This is often time relieved with anti-inflammatories and time. Sometimes you may find that certain movements or sleeping on your side make this problem worse. Cardiologist who evaluated you today felt very reassured. Denies dysphagia can also be related to reflux. As that you were given a GI cocktail to help with any esophageal irritation. Please consider using a PPI or H2 blocker to help with further discomfort. Please follow-up with your primary care doctor in 1-2 weeks.

## 2015-11-05 NOTE — ED Notes (Signed)
Pt [employer] at bedside

## 2016-01-02 ENCOUNTER — Ambulatory Visit: Payer: Medicare Other | Admitting: Neurology

## 2016-05-18 ENCOUNTER — Ambulatory Visit (INDEPENDENT_AMBULATORY_CARE_PROVIDER_SITE_OTHER): Payer: Medicare Other | Admitting: Podiatry

## 2016-05-18 VITALS — Resp 16

## 2016-05-18 DIAGNOSIS — L603 Nail dystrophy: Secondary | ICD-10-CM | POA: Diagnosis not present

## 2016-05-18 DIAGNOSIS — L608 Other nail disorders: Secondary | ICD-10-CM

## 2016-05-18 DIAGNOSIS — M79609 Pain in unspecified limb: Secondary | ICD-10-CM | POA: Diagnosis not present

## 2016-05-18 DIAGNOSIS — L851 Acquired keratosis [keratoderma] palmaris et plantaris: Secondary | ICD-10-CM | POA: Diagnosis not present

## 2016-05-18 DIAGNOSIS — M79671 Pain in right foot: Secondary | ICD-10-CM

## 2016-05-18 DIAGNOSIS — M79672 Pain in left foot: Secondary | ICD-10-CM | POA: Diagnosis not present

## 2016-05-18 DIAGNOSIS — L84 Corns and callosities: Secondary | ICD-10-CM

## 2016-05-18 DIAGNOSIS — B351 Tinea unguium: Secondary | ICD-10-CM

## 2016-05-18 DIAGNOSIS — L03115 Cellulitis of right lower limb: Secondary | ICD-10-CM

## 2016-05-18 MED ORDER — DOXYCYCLINE HYCLATE 100 MG PO TABS: 100.0000 mg | ORAL_TABLET | Freq: Two times a day (BID) | ORAL | 0 refills | Status: AC

## 2016-05-18 NOTE — Progress Notes (Signed)
   Subjective:    Patient ID: Jennifer Olson, female    DOB: 02/16/1926, 83 y.o.   MRN: [id number]  HPI    Review of Systems  All other systems reviewed and are negative.      Objective:   Physical Exam        Assessment & Plan:

## 2016-05-23 NOTE — Progress Notes (Signed)
Patient ID: Jennifer Olson, female   DOB: 09/17/1925, 83 y.o.   MRN: [id number]   SUBJECTIVE Patient presents today for multiple complaints. Patient states that she has a painful callus lesion on the bottom of her right forefoot. She states that is painful to walk. Patient also complains of thickened elongated nails 1-5 bilateral. Patient denies having diabetes mellitus.  OBJECTIVE General Patient is awake, alert, and oriented x 3 and in no acute distress. Derm symptomatic hyperkeratotic skin lesion noted to the weightbearing surface of the right forefoot. partially detached right great toenail secondary to trauma. There is sanguinous drainage noted. Periwound is cellulitic. Skin is dry and supple bilateral. Negative open lesions or macerations. Remaining integument unremarkable. Nails are tender, long, thickened and dystrophic with subungual debris, consistent with onychomycosis, 1-5 bilateral. No signs of infection noted. Vasc  DP and PT pedal pulses palpable bilaterally. Temperature gradient within normal limits.  Neuro Epicritic and protective threshold sensation diminished bilaterally.  Musculoskeletal Exam mild edema with cellulitis noted to the right foot likely secondary to evidence of partially detached right great toenail with bleeding. No symptomatic pedal deformities noted bilateral. Muscular strength within normal limits.  ASSESSMENT 1. Onychodystrophic nails 1-5 bilateral with hyperkeratosis of nails.  2. Onychomycosis of nail due to dermatophyte bilateral 3. Cellulitis right foot 4. Partially detached right great toe nail with sanguinous drainage 5. Porokeratosis right forefoot  PLAN OF CARE 1. Patient evaluated today.  2. Instructed to maintain good pedal hygiene and foot care.  3. Mechanical debridement of nails 1-5 bilaterally performed using a nail nipper. Filed with dremel without incident.  4. Prescription for doxycycline 5. Excisional debridement of painful callus lesion  was performed using a chisel blade without incident. 6. Metatarsal pads dispensed 7. Return to clinic in 2 weeks  Felecia ShellingBrent M. Zaneta Lightcap, DPM [facility]  Dr. Felecia ShellingBrent M. Reeda Soohoo, DPM    [address]                                        [place], [place] [address]                Office [phone]  Fax [phone]

## 2016-06-01 ENCOUNTER — Telehealth: Payer: Self-pay | Admitting: *Deleted

## 2016-06-01 ENCOUNTER — Ambulatory Visit (INDEPENDENT_AMBULATORY_CARE_PROVIDER_SITE_OTHER): Payer: Medicare Other | Admitting: Podiatry

## 2016-06-01 DIAGNOSIS — L84 Corns and callosities: Secondary | ICD-10-CM | POA: Diagnosis not present

## 2016-06-01 DIAGNOSIS — I739 Peripheral vascular disease, unspecified: Secondary | ICD-10-CM

## 2016-06-01 DIAGNOSIS — L851 Acquired keratosis [keratoderma] palmaris et plantaris: Secondary | ICD-10-CM

## 2016-06-01 NOTE — Progress Notes (Signed)
Patient ID: Jennifer AlkenGene Olson, female   DOB: 01/08/1926, 83 y.o.   MRN: [id number]   SUBJECTIVE Patient presents today for follow-up evaluation of a callus to the right foot. Last visit doxycycline was prescribed for the patient for mild cellulitis to the right lower extremity. Patient states that she took the doxycycline. Patient presents today for follow-up treatment and evaluation  OBJECTIVE General Patient is awake, alert, and oriented x 3 and in no acute distress. Derm symptomatic hyperkeratotic skin lesion noted to the weightbearing surface of the right forefoot. partially detached right great toenail secondary to trauma. Negative for drainage. Remaining integument unremarkable. Nails are tender, long, thickened and dystrophic with subungual debris, consistent with onychomycosis, 1-5 bilateral. No signs of infection noted. Vasc  pedal pulses diminished in the right lower extremity with right lower extremity colder than the contralateral limb likely consistent with a peripheral vascular disease. Delayed capillary refill noted to the right foot Neuro Epicritic and protective threshold sensation diminished bilaterally.  Musculoskeletal Exam mild edema with cellulitis noted to the right foot likely secondary to evidence of partially detached right great toenail with bleeding. No symptomatic pedal deformities noted bilateral. Muscular strength within normal limits.  ASSESSMENT 1. Peripheral vascular disease right lower extremity 2. Pre-ulcerative callus lesion right foot  PLAN OF CARE 1. Patient evaluated today.  2. Instructed to maintain good pedal hygiene and foot care.  3.  Today we recommend follow-up with a vascular specialist for evaluation of possible peripheral vascular disease and discoloration to the right lower extremity. 4. Excisional debridement of the callus lesion was performed to the right foot using a chisel blade without incident or bleeding. 5. Return to clinic in 3 months for  routine foot care    Felecia ShellingBrent M. Evans, DPM [facility]  Dr. Felecia ShellingBrent M. Evans, DPM    [address]                                        [place], [place] [address]                Office [phone]  Fax [phone]

## 2016-06-01 NOTE — Telephone Encounter (Addendum)
-----   Message from Melody Carmela RimaA Parry sent at 06/01/2016  8:40 AM EST ----- Regarding: Vascular referal  Contact: [phone] Refer to vascular for right lower PVD.  Phone number is daughters.  Faxed referral, clinicals and Demographics sent to [facility]. 06/03/2016-Lisa Rondel OhBenson - [facility] asked if they could get metatarsal pads from our office. Viviann SpareLisa Benson left fax number. I faxed note informing Viviann SpareLisa Benson she could contact the [place] office [phone] to get pricing and order.

## 2016-06-14 ENCOUNTER — Encounter (INDEPENDENT_AMBULATORY_CARE_PROVIDER_SITE_OTHER): Payer: Medicare Other | Admitting: Vascular Surgery

## 2016-07-08 ENCOUNTER — Encounter (INDEPENDENT_AMBULATORY_CARE_PROVIDER_SITE_OTHER): Payer: Self-pay | Admitting: Vascular Surgery

## 2016-07-08 ENCOUNTER — Ambulatory Visit (INDEPENDENT_AMBULATORY_CARE_PROVIDER_SITE_OTHER): Payer: Medicare Other | Admitting: Vascular Surgery

## 2016-07-08 VITALS — BP 102/60 | HR 66 | Resp 17 | Wt 179.9 lb

## 2016-07-08 DIAGNOSIS — J449 Chronic obstructive pulmonary disease, unspecified: Secondary | ICD-10-CM

## 2016-07-08 DIAGNOSIS — I872 Venous insufficiency (chronic) (peripheral): Secondary | ICD-10-CM

## 2016-07-08 DIAGNOSIS — I739 Peripheral vascular disease, unspecified: Secondary | ICD-10-CM

## 2016-07-08 DIAGNOSIS — I509 Heart failure, unspecified: Secondary | ICD-10-CM

## 2016-07-08 DIAGNOSIS — E782 Mixed hyperlipidemia: Secondary | ICD-10-CM | POA: Diagnosis not present

## 2016-07-08 NOTE — Progress Notes (Signed)
MRN : [id number]  Jennifer Olson is a 83 y.o. ([birth year]) female who presents with chief complaint of  Chief Complaint  Patient presents with  . New Patient (Initial Visit)  .  History of Present Illness: Patient is seen for evaluation of leg pain and leg swelling. The patient first noticed the swelling remotely. The right leg is much worse than the left.  The swelling is associated with pain and discoloration. The pain and swelling worsens with prolonged dependency and improves with elevation. The pain is unrelated to activity.  The patient notes that in the morning the legs are significantly improved but they steadily worsened throughout the course of the day. The patient also notes a steady worsening of the discoloration in the ankle and shin area.   The patient denies claudication symptoms.  The patient denies symptoms consistent with rest pain.  The patient denies and extensive history of DJD and LS spine disease.  The patient has no had any past angiography, interventions or vascular surgery.  Elevation makes the leg symptoms better, dependency makes them much worse. There is no history of ulcerations. The patient denies any recent changes in medications.  The patient has not been wearing graduated compression.  The patient denies a history of DVT or PE. There is no prior history of phlebitis. There is no history of primary lymphedema.  No history of malignancies. No history of trauma or groin or pelvic surgery. There is no history of radiation treatment to the groin or pelvis  The patient denies amaurosis fugax or recent TIA symptoms. There are no recent neurological changes noted. The patient denies recent episodes of angina or shortness of breath  Current Meds  Medication Sig  . alum & mag hydroxide-simeth (MAALOX PLUS) 400-400-40 MG/5ML suspension Take 30 mLs by mouth every 4 (four) hours as needed (indigestion/reflux/gas).  . baclofen (LIORESAL) 10 MG tablet Take 5 mg by  mouth 2 (two) times daily.  . budesonide-formoterol (SYMBICORT) 160-4.5 MCG/ACT inhaler Inhale 2 puffs into the lungs 2 (two) times daily.  . busPIRone (BUSPAR) 5 MG tablet Take 1 tablet (5 mg total) by mouth 2 (two) times daily. (Patient taking differently: Take 5 mg by mouth daily. )  . carbidopa-levodopa (SINEMET IR) 25-100 MG tablet Take 1 tablet by mouth 3 (three) times daily.  [name] escitalopram (LEXAPRO) 10 MG tablet Take 1 tablet (10 mg total) by mouth daily.  . ferrous sulfate 325 (65 FE) MG tablet Take 1 tablet (325 mg total) by mouth 3 (three) times daily after meals.  . fluticasone (FLONASE) 50 MCG/ACT nasal spray Place 2 sprays into both nostrils daily as needed for allergies or rhinitis.   . Fluticasone-Salmeterol (ADVAIR) 500-50 MCG/DOSE AEPB Inhale 1 puff into the lungs 2 times daily at 12 noon and 4 pm.  . furosemide (LASIX) 40 MG tablet Take 1 tablet (40 mg total) by mouth daily. (Patient taking differently: Take 60 mg by mouth daily. )  . Hypromellose (ARTIFICIAL TEARS OP) Place 2 drops into both eyes every 3 (three) hours as needed (for dry eyes).  . meloxicam (MOBIC) 15 MG tablet Take 1 tablet (15 mg total) by mouth daily. (Patient taking differently: Take 15 mg by mouth daily. For pain and inflammation)  . nitroGLYCERIN (NITROSTAT) 0.4 MG SL tablet Place 0.4 mg under the tongue every 5 (five) minutes as needed for chest pain.  [name] nystatin (NYSTATIN) powder Apply topically 2 (two) times daily.  . Oxycodone HCl 10 MG TABS Take 10 mg  by mouth 4 (four) times daily.  . pantoprazole (PROTONIX) 20 MG tablet Take 1 tablet (20 mg total) by mouth daily.  . polyethylene glycol (MIRALAX / GLYCOLAX) packet Take 17 g by mouth 2 (two) times daily. In 4 ounces of liquid and hold for loose stools  . Potassium Chloride ER 20 MEQ TBCR Take 40 mEq by mouth daily with breakfast.  . sennosides-docusate sodium (SENOKOT-S) 8.6-50 MG tablet Take 1-2 tablets by mouth See admin instructions. 1 tablet in the  morning and 2 tablets at bedtime- For constipation and hold for loose stools  . simvastatin (ZOCOR) 20 MG tablet Take 1 tablet (20 mg total) by mouth at bedtime.  . traMADol (ULTRAM) 50 MG tablet Take 1 tablet (50 mg total) by mouth every 8 (eight) hours as needed for moderate pain. (Patient taking differently: Take 25 mg by mouth 2 (two) times daily. And may take 50 mg three times daily as needed for moderate pain)  . traZODone (DESYREL) 50 MG tablet Take 1 tablet (50 mg total) by mouth at bedtime. (Patient taking differently: Take 25 mg by mouth at bedtime. )  . Vitamin D, Ergocalciferol, (DRISDOL) 50000 units CAPS capsule Take 50,000 Units by mouth every 7 (seven) days.    Past Medical History:  Diagnosis Date  . Aneurysm artery, iliac (HCC)   . Aneurysm of abdominal aorta (HCC)   . Arthritis   . Blood transfusion without reported diagnosis   . CHF (congestive heart failure) (HCC)   . Depression   . Heart attack   . History of fainting spells of unknown cause   . Migraines   . Oxygen dependent    Pt unsure of why she is on oxygen.  . Parkinson disease (HCC)   . Urinary incontinence   . UTI (urinary tract infection)     Past Surgical History:  Procedure Laterality Date  . APPENDECTOMY  1980  . BREAST SURGERY     Biopsy  . Broken Hip  2015   Right Hip  . CESAREAN SECTION     1956/1962  . FEMUR IM NAIL Left 02/21/2015   Procedure: INTRAMEDULLARY (IM) NAIL FEMORAL;  Surgeon: Juanell FairlyKevin Krasinski, MD;  Location: [facility] ORS;  Service: Orthopedics;  Laterality: Left;  . FOOT SURGERY  1976   Both Feet  . KNEE SURGERY     Both Knees/1999/2000  . TONSILLECTOMY AND ADENOIDECTOMY  1934    Social History Social History  Substance Use Topics  . Smoking status: Never Smoker  . Smokeless tobacco: Never Used  . Alcohol use 0.0 oz/week     Comment: wine occasionally    Family History Family History  Problem Relation Age of Onset  . Sudden death Brother   No family history of  bleeding/clotting disorders, porphyria or autoimmune disease   Allergies  Allergen Reactions  . Penicillins Rash    Has patient had a PCN reaction causing immediate rash, facial/tongue/throat swelling, SOB or lightheadedness with hypotension: Yes Has patient had a PCN reaction causing severe rash involving mucus membranes or skin necrosis: No Has patient had a PCN reaction that required hospitalization No Has patient had a PCN reaction occurring within the last 10 years: Yes If all of the above answers are "NO", then may proceed with Cephalosporin use.   . Latex Itching and Rash     REVIEW OF SYSTEMS (Negative unless checked)  Constitutional: [] Weight loss  [] Fever  [] Chills Cardiac: [] Chest pain   [] Chest pressure   [] Palpitations   [] Shortness of  breath when laying flat   [] Shortness of breath with exertion. Vascular:  [] Pain in legs with walking   [x] Pain in legs at rest  [x] History of DVT   [] Phlebitis   [x] Swelling in legs   [] Varicose veins   [] Non-healing ulcers Pulmonary:   [] Uses home oxygen   [] Productive cough   [] Hemoptysis   [] Wheeze  [] COPD   [] Asthma Neurologic:  [] Dizziness   [] Seizures   [] History of stroke   [] History of TIA  [] Aphasia   [] Vissual changes   [] Weakness or numbness in arm   [] Weakness or numbness in leg Musculoskeletal:   [x] Joint swelling   [x] Joint pain   [] Low back pain Hematologic:  [] Easy bruising  [] Easy bleeding   [] Hypercoagulable state   [] Anemic Gastrointestinal:  [] Diarrhea   [] Vomiting  [] Gastroesophageal reflux/heartburn   [] Difficulty swallowing. Genitourinary:  [] Chronic kidney disease   [] Difficult urination  [] Frequent urination   [] Blood in urine Skin:  [] Rashes   [] Ulcers  Psychological:  [] History of anxiety   []  History of major depression.  Physical Examination  Vitals:   07/08/16 1025  BP: 102/60  Pulse: 66  Resp: 17  Weight: 81.6 kg (179 lb 14.4 oz)   Body mass index is 29.97 kg/m. Gen: WD/WN, NAD Head: [place]/AT, No  temporalis wasting.  Ear/Nose/Throat: Hearing grossly intact, nares w/o erythema or drainage, poor dentition Eyes: PER, EOMI, sclera nonicteric.  Neck: Supple, no masses.  No bruit or JVD.  Pulmonary:  Good air movement, clear to auscultation bilaterally, no use of accessory muscles.  Cardiac: RRR, normal S1, S2, no Murmurs. Vascular: 2-3+ edema bilaterally with severe venous changes bilaterally.  No venous ulcers noted in the ankle area bilaterally, ankles appear noninfected Vessel Right Left  Radial Palpable Palpable  Ulnar Palpable Palpable  Brachial Palpable Palpable  Carotid Palpable Palpable  Femoral Palpable Palpable  Popliteal Not Palpable Not Palpable  PT Not Palpable Not Palpable  DP Not Palpable Not Palpable   Gastrointestinal: soft, non-distended. No guarding/no peritoneal signs.  Musculoskeletal: M/S 5/5 throughout.  No deformity or atrophy.  Neurologic: CN 2-12 intact. Pain and light touch intact in extremities.  Symmetrical.  Speech is fluent. Motor exam as listed above. Psychiatric: Judgment intact, Mood & affect appropriate for pt's clinical situation. Dermatologic: Venous stasis dermatitis without ulcers present.  No changes consistent with cellulitis. Lymph : No Cervical lymphadenopathy, no lichenification or skin changes of chronic lymphedema.  CBC Lab Results  Component Value Date   WBC 7.1 11/05/2015   HGB 11.6 (L) 11/05/2015   HCT 37.0 11/05/2015   MCV 98.1 11/05/2015   PLT 259 11/05/2015    BMET    Component Value Date/Time   NA 140 11/05/2015 1013   K 4.4 11/05/2015 1013   CL 102 11/05/2015 1013   CO2 32 11/05/2015 1013   GLUCOSE 88 11/05/2015 1013   BUN 12 11/05/2015 1013   CREATININE 0.94 11/05/2015 1013   CALCIUM 8.8 (L) 11/05/2015 1013   GFRNONAA 53 (L) 11/05/2015 1013   GFRAA >60 11/05/2015 1013   CrCl cannot be calculated (Patient's most recent lab result is older than the maximum 21 days allowed.).  COAG Lab Results  Component Value  Date   INR 0.98 02/20/2015    Radiology No results found.   Assessment/Plan 1. PVD (peripheral vascular disease) (HCC) Recommend:  Patient should undergo arterial duplex of the lower extremity ASAP because there has been a significant deterioration in the patient's lower extremity symptoms.  The patient states they are  having increased pain and a marked decrease in the distance that they can walk.  The risks and benefits as well as the alternatives were discussed in detail with the patient.  All questions were answered.  Patient agrees to proceed and understands this could be a prelude to angiography and intervention.  The patient will follow up with me in the office to review the studies.   2. Stasis dermatitis of both legs No surgery or intervention at this point in time.    I have had a long discussion with the patient regarding venous insufficiency and why it  causes symptoms. I have discussed with the patient the chronic skin changes that accompany venous insufficiency and the long term sequela such as infection and ulceration.  Patient will begin wearing graduated compression stockings class 1 (20-30 mmHg) or compression wraps on a daily basis a prescription was given. The patient will put the stockings on first thing in the morning and removing them in the evening. The patient is instructed specifically not to sleep in the stockings.    In addition, behavioral modification including several periods of elevation of the lower extremities during the day will be continued. I have demonstrated that proper elevation is a position with the ankles at heart level.  The patient is instructed to begin routine exercise, especially walking on a daily basis  Patient should undergo duplex ultrasound of the venous system to ensure that DVT or reflux is not present.  Following the review of the ultrasound the patient will follow up in 2-3 months to reassess the degree of swelling and the control  that graduated compression stockings or compression wraps  is offering.   The patient can be assessed for a Lymph Pump at that time  3. Congestive heart failure, unspecified congestive heart failure chronicity, unspecified congestive heart failure type (HCC) Continue cardiac and antihypertensive medications as already ordered and reviewed, no changes at this time.  Continue statin as ordered and reviewed, no changes at this time  Nitrates PRN for chest pain   4. Chronic obstructive pulmonary disease, unspecified COPD type (HCC) Continue pulmonary medications and aerosols as already ordered, these medications have been reviewed and there are no changes at this time.   5. Mixed hyperlipidemia Continue statin as ordered and reviewed, no changes at this time     Levora Dredge, MD  07/08/2016 10:44 AM

## 2016-07-22 ENCOUNTER — Encounter (INDEPENDENT_AMBULATORY_CARE_PROVIDER_SITE_OTHER): Payer: Medicare Other

## 2016-07-22 ENCOUNTER — Ambulatory Visit (INDEPENDENT_AMBULATORY_CARE_PROVIDER_SITE_OTHER): Payer: Medicare Other | Admitting: Vascular Surgery

## 2016-08-17 ENCOUNTER — Ambulatory Visit: Payer: Medicare Other | Admitting: Podiatry

## 2016-08-24 DEATH — deceased

## 2016-10-04 ENCOUNTER — Encounter (INDEPENDENT_AMBULATORY_CARE_PROVIDER_SITE_OTHER): Payer: Medicare Other

## 2016-10-04 ENCOUNTER — Ambulatory Visit (INDEPENDENT_AMBULATORY_CARE_PROVIDER_SITE_OTHER): Payer: Medicare Other | Admitting: Vascular Surgery

## 2017-01-27 IMAGING — CR DG CHEST 1V PORT
1 series · 1 of 1 positions shown · non-contrast
Comparison: None.

CLINICAL DATA: Tripped over area rug and fell at home. Left femur
fracture. Preop.

EXAM:
PORTABLE CHEST 1 VIEW

[dg chest port 1 view]
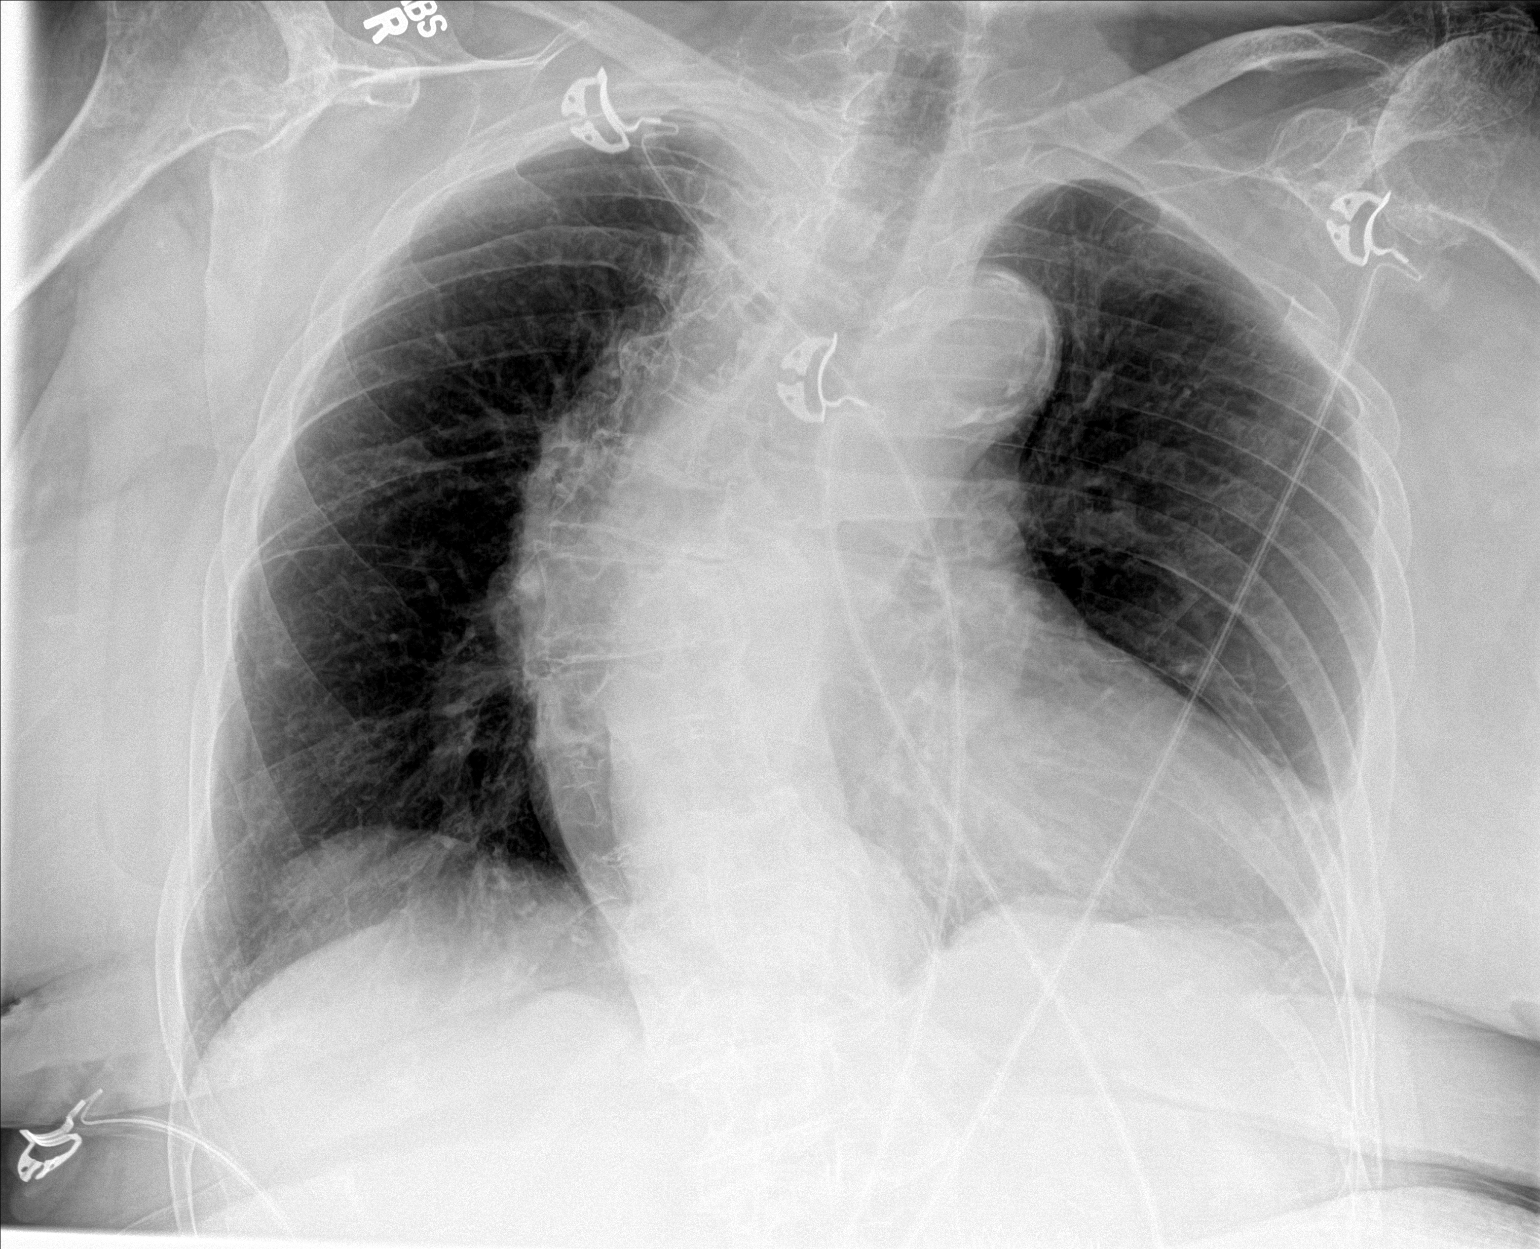

[1 of 1 positions shown; findings below may reference images not displayed]

FINDINGS: The cardiac silhouette is upper limits of normal to mildly enlarged
in size. Thoracic aortic calcification is noted. No airspace
consolidation, edema, pleural effusion, or pneumothorax is
identified. There is minimal left basilar scarring. Moderate
thoracic dextroscoliosis is present.
IMPRESSION: 1. No evidence of acute airspace disease.
2. Borderline cardiomegaly.
3. Moderate thoracic dextroscoliosis.

## 2017-10-12 IMAGING — CT CT ANGIO CHEST
2 of 6 series · 18 of 36 positions shown · IV contrast (Omni 300)
Comparison: 11/05/2015 and prior radiographs

CLINICAL DATA: 89-year-old female with acute chest pain and
shortness of breath today.

EXAM:
CT ANGIOGRAPHY CHEST WITH CONTRAST
TECHNIQUE: Multidetector CT imaging of the chest was performed using the
standard protocol during bolus administration of intravenous
contrast. Multiplanar CT image reconstructions and MIPs were
obtained to evaluate the vascular anatomy.
CONTRAST:  100 cc intravenous Isovue 370

[Series 6: pe thins · axial · 0.68mm/px · z∈[+1198,+1460]mm · 17 of 297 slices shown]
[im 17/297  lung]
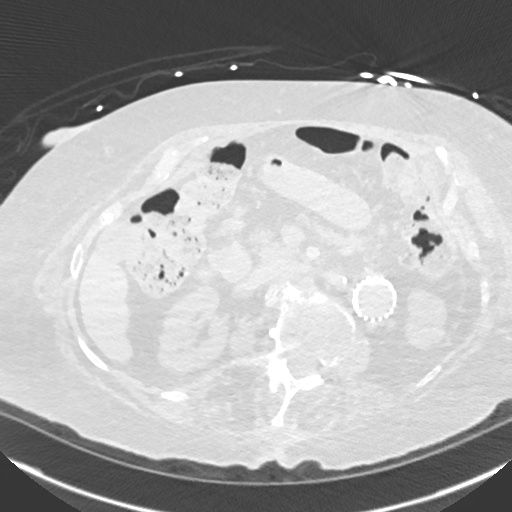
[im 33/297  mediastinal]
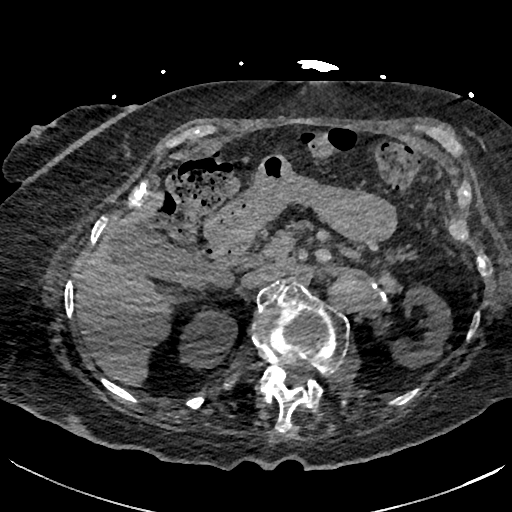
[im 50/297  lung]
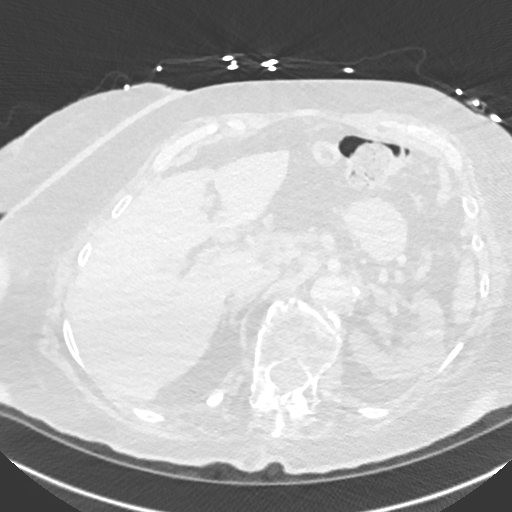
[im 66/297  mediastinal]
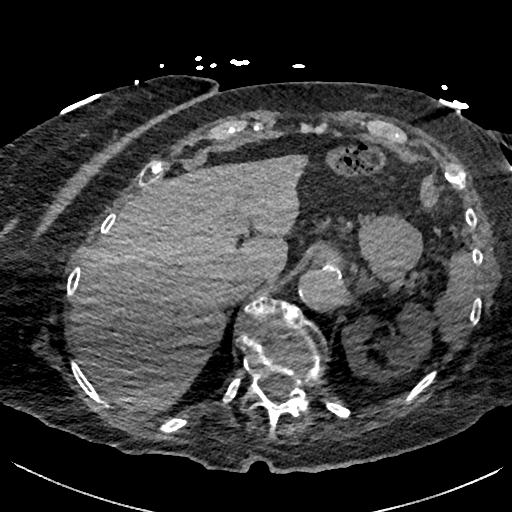
[im 83/297  lung]
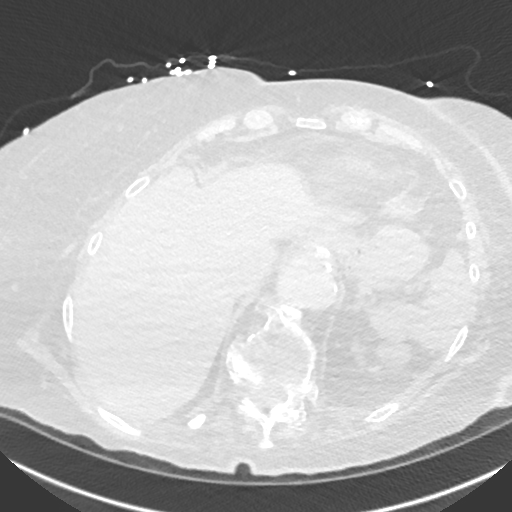
[im 99/297  mediastinal]
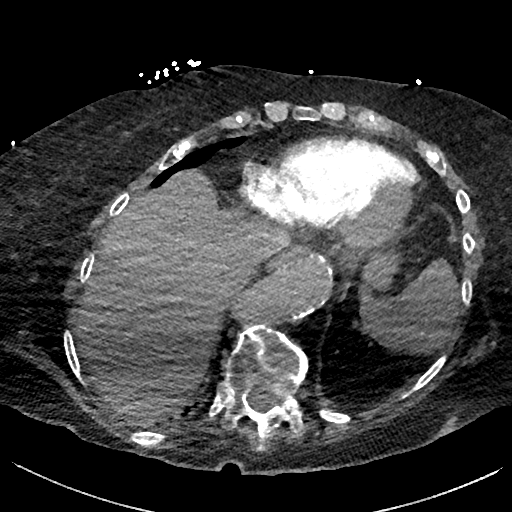
[im 116/297  lung]
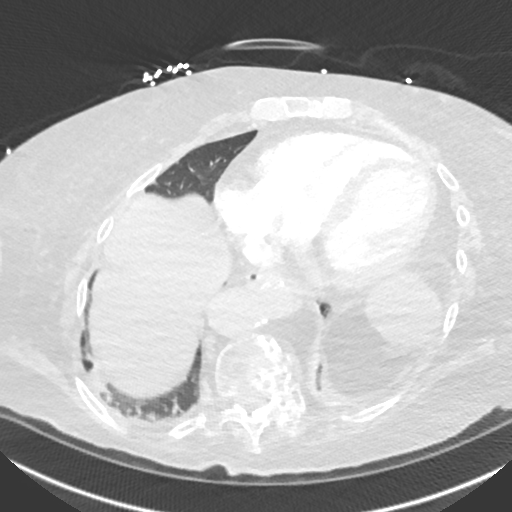
[im 132/297  mediastinal]
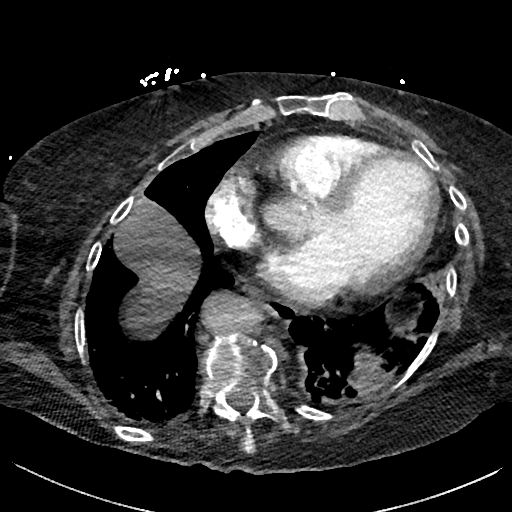
[im 149/297  lung]
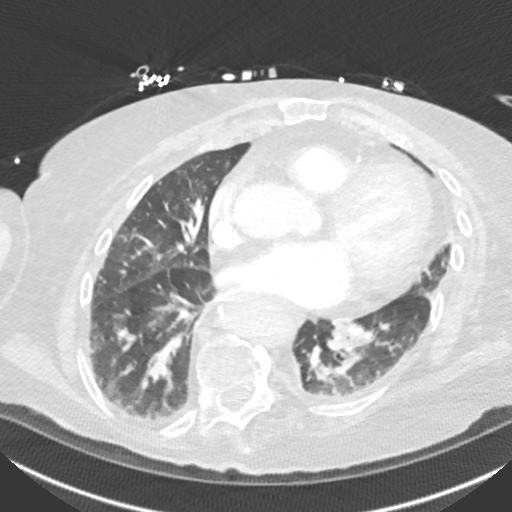
[im 165/297  mediastinal]
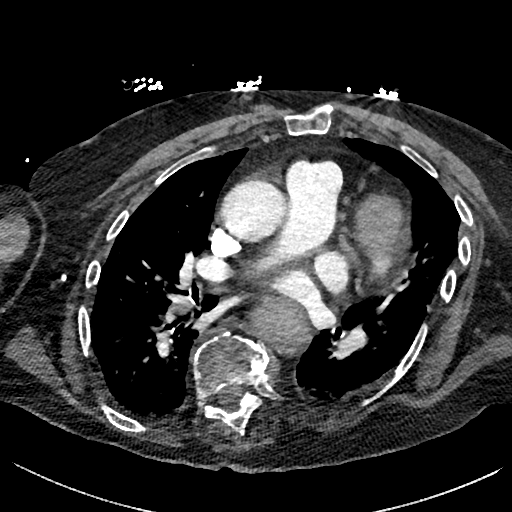
[im 181/297  lung]
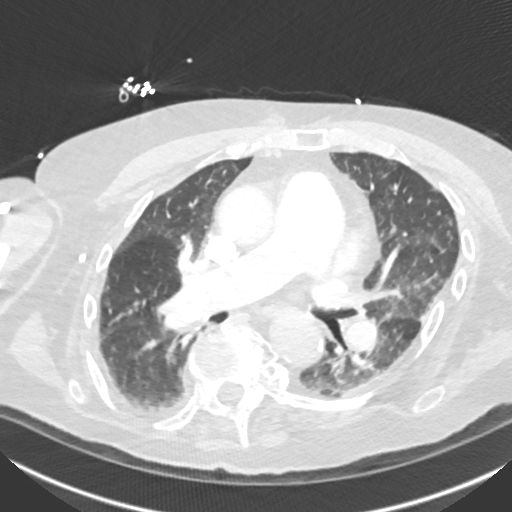
[im 198/297  mediastinal]
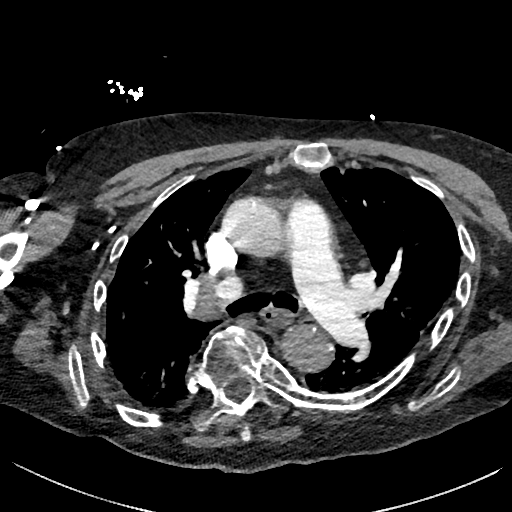
[im 214/297  lung]
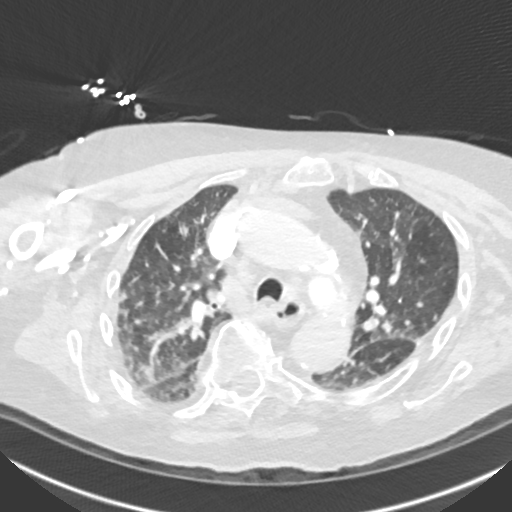
[im 231/297  mediastinal]
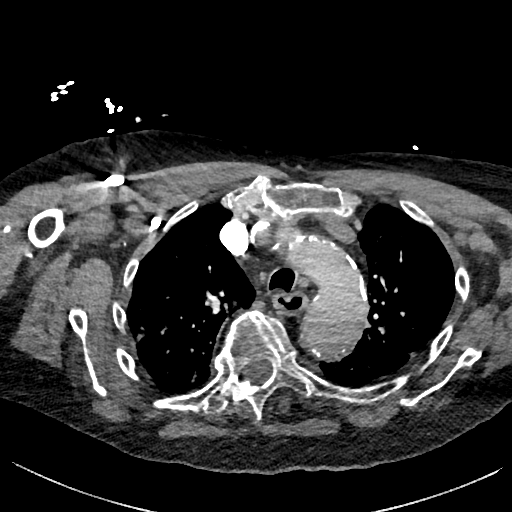
[im 247/297  lung]
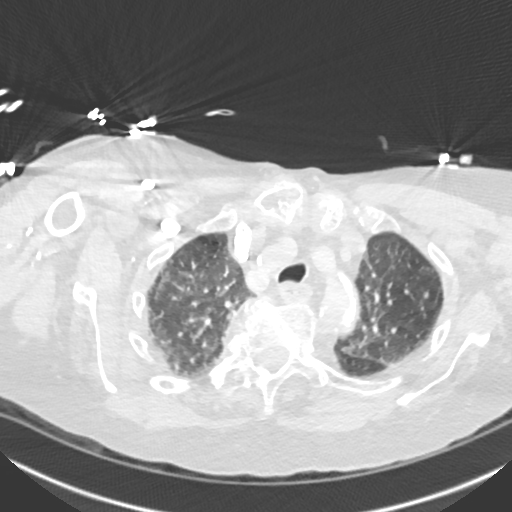
[im 264/297  mediastinal]
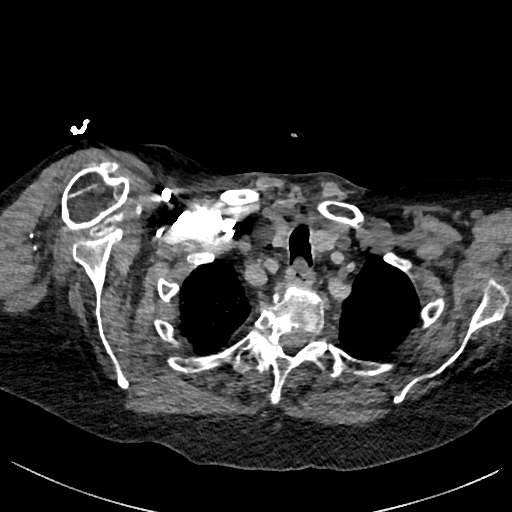
[im 280/297  lung]
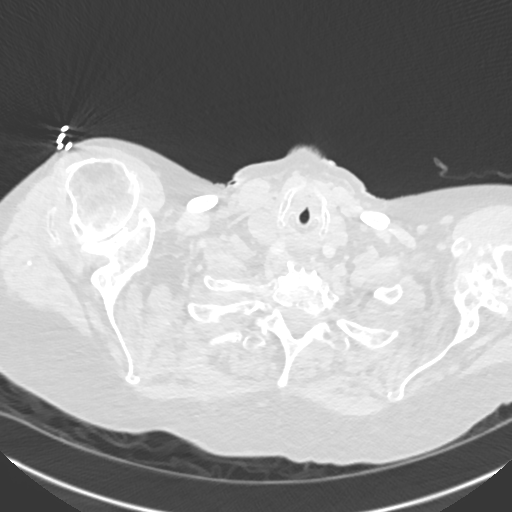

[Series 7: pe 2mm cor · coronal · 0.64mm/px · 1 of 150 slices shown]
[im 75/150  mediastinal]
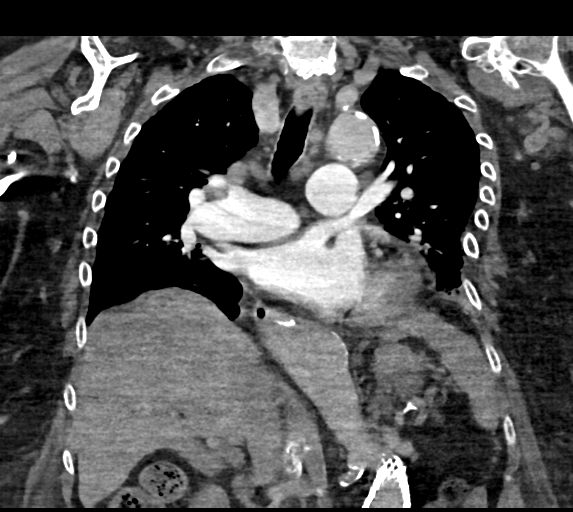

[18 of 36 positions shown; findings below may reference images not displayed]

FINDINGS: Cardiovascular: This is a technically adequate study although
respiratory motion artifact decreases sensitivity and 4 sons of the
lungs.

There is no evidence of pulmonary emboli.

The thoracic aorta is tortuous and ectatic, with the transverse
aorta measuring 3.7 cm in greatest diameter. Cardiomegaly and
scratch de cardiomegaly identified.

Mediastinum/Nodes: Shotty mediastinal and right hilar lymph nodes
are identified. There is no evidence of pericardial effusion or
mediastinal mass.

Lungs/Pleura: Ground-glass opacities within the central and lower
lungs identified. Mild dependent and basilar scarring/ atelectasis
identified. There is no evidence of pleural effusion or
pneumothorax. No pulmonary mass identified.

Upper Abdomen: No acute abnormality. Abdominal aortic stent
identified.

Musculoskeletal: A moderate severe thoracolumbar scoliosis
identified. No acute abnormality noted.

Review of the MIP images confirms the above findings.
IMPRESSION: No evidence of pulmonary emboli.

Bilateral central and lower lung ground-glass opacities which are
nonspecific and may represent infection or inflammation.

Cardiomegaly with tortuous and ectatic thoracic aorta.

Aortic atherosclerosis.
